# Patient Record
Sex: Female | Born: 1960
Health system: Southern US, Community
[De-identification: ages and names within clinical notes are randomized; demographics above are authoritative.]

## PROBLEM LIST (undated history)

## (undated) DIAGNOSIS — I2699 Other pulmonary embolism without acute cor pulmonale: Secondary | ICD-10-CM

## (undated) DIAGNOSIS — J45909 Unspecified asthma, uncomplicated: Secondary | ICD-10-CM

## (undated) DIAGNOSIS — W3400XA Accidental discharge from unspecified firearms or gun, initial encounter: Secondary | ICD-10-CM

## (undated) DIAGNOSIS — M1990 Unspecified osteoarthritis, unspecified site: Secondary | ICD-10-CM

## (undated) DIAGNOSIS — I10 Essential (primary) hypertension: Secondary | ICD-10-CM

## (undated) HISTORY — PX: OTHER SURGICAL HISTORY: SHX169

---

## 2003-09-23 ENCOUNTER — Emergency Department (HOSPITAL_COMMUNITY): Admission: EM | Admit: 2003-09-23 | Discharge: 2003-09-24 | Payer: Self-pay

## 2003-10-25 ENCOUNTER — Ambulatory Visit: Payer: Self-pay | Admitting: Family Medicine

## 2005-03-02 ENCOUNTER — Emergency Department (HOSPITAL_COMMUNITY): Admission: EM | Admit: 2005-03-02 | Discharge: 2005-03-02 | Payer: Self-pay | Admitting: *Deleted

## 2005-03-04 ENCOUNTER — Ambulatory Visit: Payer: Self-pay | Admitting: *Deleted

## 2005-04-15 ENCOUNTER — Ambulatory Visit: Payer: Self-pay | Admitting: Family Medicine

## 2005-04-28 ENCOUNTER — Ambulatory Visit: Payer: Self-pay | Admitting: Family Medicine

## 2005-08-05 ENCOUNTER — Ambulatory Visit: Payer: Self-pay | Admitting: Family Medicine

## 2005-08-11 ENCOUNTER — Ambulatory Visit (HOSPITAL_COMMUNITY): Admission: RE | Admit: 2005-08-11 | Discharge: 2005-08-11 | Payer: Self-pay | Admitting: Family Medicine

## 2005-08-25 ENCOUNTER — Encounter: Admission: RE | Admit: 2005-08-25 | Discharge: 2005-08-25 | Payer: Self-pay | Admitting: Family Medicine

## 2005-11-11 ENCOUNTER — Ambulatory Visit: Payer: Self-pay | Admitting: Family Medicine

## 2006-03-16 ENCOUNTER — Inpatient Hospital Stay (HOSPITAL_COMMUNITY): Admission: EM | Admit: 2006-03-16 | Discharge: 2006-03-22 | Payer: Self-pay | Admitting: Emergency Medicine

## 2006-09-21 ENCOUNTER — Encounter (INDEPENDENT_AMBULATORY_CARE_PROVIDER_SITE_OTHER): Payer: Self-pay | Admitting: Family Medicine

## 2006-11-04 DIAGNOSIS — I10 Essential (primary) hypertension: Secondary | ICD-10-CM | POA: Insufficient documentation

## 2006-11-04 DIAGNOSIS — K055 Other periodontal diseases: Secondary | ICD-10-CM

## 2006-11-04 DIAGNOSIS — Z9189 Other specified personal risk factors, not elsewhere classified: Secondary | ICD-10-CM

## 2006-11-04 DIAGNOSIS — K089 Disorder of teeth and supporting structures, unspecified: Secondary | ICD-10-CM | POA: Insufficient documentation

## 2007-02-01 ENCOUNTER — Ambulatory Visit: Payer: Self-pay | Admitting: Family Medicine

## 2007-02-01 DIAGNOSIS — J441 Chronic obstructive pulmonary disease with (acute) exacerbation: Secondary | ICD-10-CM

## 2007-08-24 ENCOUNTER — Ambulatory Visit: Payer: Self-pay | Admitting: Internal Medicine

## 2007-08-24 ENCOUNTER — Encounter (INDEPENDENT_AMBULATORY_CARE_PROVIDER_SITE_OTHER): Payer: Self-pay | Admitting: Family Medicine

## 2009-04-14 ENCOUNTER — Emergency Department (HOSPITAL_COMMUNITY): Admission: EM | Admit: 2009-04-14 | Discharge: 2009-04-14 | Payer: Self-pay | Admitting: Emergency Medicine

## 2009-07-03 ENCOUNTER — Inpatient Hospital Stay (HOSPITAL_COMMUNITY): Admission: EM | Admit: 2009-07-03 | Discharge: 2009-07-09 | Payer: Self-pay | Admitting: Emergency Medicine

## 2009-07-03 ENCOUNTER — Encounter: Payer: Self-pay | Admitting: Physician Assistant

## 2009-07-11 ENCOUNTER — Ambulatory Visit: Payer: Self-pay | Admitting: Physician Assistant

## 2009-07-11 ENCOUNTER — Encounter: Payer: Self-pay | Admitting: Physician Assistant

## 2009-07-15 ENCOUNTER — Ambulatory Visit: Payer: Self-pay | Admitting: Physician Assistant

## 2009-07-17 ENCOUNTER — Ambulatory Visit: Payer: Self-pay | Admitting: Physician Assistant

## 2009-07-18 ENCOUNTER — Encounter: Payer: Self-pay | Admitting: Physician Assistant

## 2009-07-19 ENCOUNTER — Ambulatory Visit: Payer: Self-pay | Admitting: Physician Assistant

## 2009-07-19 DIAGNOSIS — D509 Iron deficiency anemia, unspecified: Secondary | ICD-10-CM

## 2009-07-19 DIAGNOSIS — T148XXA Other injury of unspecified body region, initial encounter: Secondary | ICD-10-CM | POA: Insufficient documentation

## 2009-07-19 DIAGNOSIS — J449 Chronic obstructive pulmonary disease, unspecified: Secondary | ICD-10-CM | POA: Insufficient documentation

## 2009-07-19 DIAGNOSIS — K219 Gastro-esophageal reflux disease without esophagitis: Secondary | ICD-10-CM | POA: Insufficient documentation

## 2009-07-19 DIAGNOSIS — I2699 Other pulmonary embolism without acute cor pulmonale: Secondary | ICD-10-CM | POA: Insufficient documentation

## 2009-07-19 LAB — CONVERTED CEMR LAB
Basophils Absolute: 0.1 10*3/uL (ref 0.0–0.1)
Basophils Relative: 0 % (ref 0–1)
MCHC: 31.8 g/dL (ref 30.0–36.0)
MCV: 97.7 fL (ref 78.0–100.0)
Monocytes Absolute: 0.8 10*3/uL (ref 0.1–1.0)
Monocytes Relative: 6 % (ref 3–12)
Neutro Abs: 8.6 10*3/uL — ABNORMAL HIGH (ref 1.7–7.7)
Neutrophils Relative %: 71 % (ref 43–77)
OCCULT 1: POSITIVE
OCCULT 2: NEGATIVE
Platelets: 511 10*3/uL — ABNORMAL HIGH (ref 150–400)

## 2009-07-22 ENCOUNTER — Ambulatory Visit: Payer: Self-pay | Admitting: Physician Assistant

## 2009-07-25 ENCOUNTER — Encounter: Payer: Self-pay | Admitting: Physician Assistant

## 2009-07-29 ENCOUNTER — Ambulatory Visit: Payer: Self-pay | Admitting: Physician Assistant

## 2009-08-02 ENCOUNTER — Ambulatory Visit: Payer: Self-pay | Admitting: Physician Assistant

## 2009-08-06 ENCOUNTER — Ambulatory Visit: Payer: Self-pay | Admitting: Physician Assistant

## 2009-08-06 ENCOUNTER — Telehealth: Payer: Self-pay | Admitting: Physician Assistant

## 2009-08-07 ENCOUNTER — Encounter: Payer: Self-pay | Admitting: Physician Assistant

## 2009-08-07 LAB — CONVERTED CEMR LAB
Basophils Relative: 1 % (ref 0–1)
Eosinophils Absolute: 0.1 10*3/uL (ref 0.0–0.7)
Eosinophils Relative: 2 % (ref 0–5)
Lymphocytes Relative: 56 % — ABNORMAL HIGH (ref 12–46)
Lymphs Abs: 2.6 10*3/uL (ref 0.7–4.0)
MCHC: 32 g/dL (ref 30.0–36.0)
MCV: 96.4 fL (ref 78.0–100.0)
Monocytes Absolute: 0.4 10*3/uL (ref 0.1–1.0)
Monocytes Relative: 9 % (ref 3–12)
Neutro Abs: 1.4 10*3/uL — ABNORMAL LOW (ref 1.7–7.7)
WBC: 4.6 10*3/uL (ref 4.0–10.5)

## 2009-08-14 ENCOUNTER — Ambulatory Visit: Payer: Self-pay | Admitting: Physician Assistant

## 2009-08-19 ENCOUNTER — Encounter: Payer: Self-pay | Admitting: Physician Assistant

## 2009-08-21 ENCOUNTER — Ambulatory Visit: Payer: Self-pay | Admitting: Physician Assistant

## 2009-08-23 ENCOUNTER — Encounter: Payer: Self-pay | Admitting: Physician Assistant

## 2009-08-23 ENCOUNTER — Ambulatory Visit: Payer: Self-pay | Admitting: Internal Medicine

## 2009-08-24 ENCOUNTER — Ambulatory Visit (HOSPITAL_COMMUNITY): Admission: RE | Admit: 2009-08-24 | Discharge: 2009-08-24 | Payer: Self-pay | Admitting: Internal Medicine

## 2009-08-29 ENCOUNTER — Ambulatory Visit: Payer: Self-pay | Admitting: Physician Assistant

## 2009-09-05 ENCOUNTER — Telehealth: Payer: Self-pay | Admitting: Physician Assistant

## 2009-09-05 ENCOUNTER — Encounter: Payer: Self-pay | Admitting: Physician Assistant

## 2009-09-05 ENCOUNTER — Ambulatory Visit: Payer: Self-pay | Admitting: Nurse Practitioner

## 2009-09-05 DIAGNOSIS — M25569 Pain in unspecified knee: Secondary | ICD-10-CM

## 2009-09-05 DIAGNOSIS — R609 Edema, unspecified: Secondary | ICD-10-CM

## 2009-09-05 DIAGNOSIS — K921 Melena: Secondary | ICD-10-CM

## 2009-09-06 ENCOUNTER — Telehealth: Payer: Self-pay | Admitting: Physician Assistant

## 2009-09-06 LAB — CONVERTED CEMR LAB
Basophils Absolute: 0 10*3/uL (ref 0.0–0.1)
Eosinophils Relative: 1 % (ref 0–5)
HCT: 39.9 % (ref 36.0–46.0)
Hemoglobin: 13.3 g/dL (ref 12.0–15.0)
INR: 1.1 (ref <1.50)
Lymphocytes Relative: 41 % (ref 12–46)
Lymphs Abs: 2.4 10*3/uL (ref 0.7–4.0)
MCHC: 33.3 g/dL (ref 30.0–36.0)
Platelets: 334 10*3/uL (ref 150–400)
Prothrombin Time: 14.4 s (ref 11.6–15.2)
RBC: 4.21 M/uL (ref 3.87–5.11)
RDW: 16 % — ABNORMAL HIGH (ref 11.5–15.5)

## 2009-09-11 ENCOUNTER — Ambulatory Visit: Payer: Self-pay | Admitting: Physician Assistant

## 2009-09-15 ENCOUNTER — Encounter: Payer: Self-pay | Admitting: Physician Assistant

## 2009-09-18 ENCOUNTER — Telehealth: Payer: Self-pay | Admitting: Physician Assistant

## 2009-09-18 ENCOUNTER — Encounter: Payer: Self-pay | Admitting: Physician Assistant

## 2009-09-19 ENCOUNTER — Ambulatory Visit: Payer: Self-pay | Admitting: Physician Assistant

## 2009-09-19 LAB — CONVERTED CEMR LAB
CO2: 20 meq/L (ref 19–32)
Calcium: 9.7 mg/dL (ref 8.4–10.5)
Chloride: 100 meq/L (ref 96–112)
Creatinine, Ser: 0.79 mg/dL (ref 0.40–1.20)
INR: 1.87 — ABNORMAL HIGH (ref <1.50)
Sodium: 135 meq/L (ref 135–145)

## 2009-09-20 ENCOUNTER — Encounter: Payer: Self-pay | Admitting: Physician Assistant

## 2009-09-20 ENCOUNTER — Telehealth: Payer: Self-pay | Admitting: Physician Assistant

## 2009-09-25 ENCOUNTER — Ambulatory Visit: Payer: Self-pay | Admitting: Sports Medicine

## 2009-09-25 ENCOUNTER — Encounter (INDEPENDENT_AMBULATORY_CARE_PROVIDER_SITE_OTHER): Payer: Self-pay | Admitting: *Deleted

## 2009-09-25 DIAGNOSIS — IMO0002 Reserved for concepts with insufficient information to code with codable children: Secondary | ICD-10-CM

## 2009-10-07 ENCOUNTER — Encounter: Payer: Self-pay | Admitting: Physician Assistant

## 2009-10-07 ENCOUNTER — Ambulatory Visit: Payer: Self-pay | Admitting: Physician Assistant

## 2009-10-11 ENCOUNTER — Encounter: Payer: Self-pay | Admitting: Physician Assistant

## 2009-10-30 ENCOUNTER — Telehealth: Payer: Self-pay | Admitting: Physician Assistant

## 2009-10-30 ENCOUNTER — Encounter: Payer: Self-pay | Admitting: Physician Assistant

## 2009-11-01 ENCOUNTER — Encounter: Payer: Self-pay | Admitting: Physician Assistant

## 2009-11-06 ENCOUNTER — Ambulatory Visit: Payer: Self-pay | Admitting: Physician Assistant

## 2009-11-06 DIAGNOSIS — R7309 Other abnormal glucose: Secondary | ICD-10-CM

## 2009-11-06 LAB — CONVERTED CEMR LAB: Hgb A1c MFr Bld: 5.3 %

## 2009-11-07 LAB — CONVERTED CEMR LAB
INR: 1.02 (ref <1.50)
Prothrombin Time: 13.6 s (ref 11.6–15.2)

## 2009-11-11 ENCOUNTER — Encounter: Payer: Self-pay | Admitting: Sports Medicine

## 2009-11-13 ENCOUNTER — Encounter (INDEPENDENT_AMBULATORY_CARE_PROVIDER_SITE_OTHER): Payer: Self-pay | Admitting: *Deleted

## 2009-11-15 ENCOUNTER — Encounter: Payer: Self-pay | Admitting: Physician Assistant

## 2009-12-03 ENCOUNTER — Ambulatory Visit (HOSPITAL_COMMUNITY): Admission: RE | Admit: 2009-12-03 | Discharge: 2009-12-03 | Payer: Self-pay | Admitting: Internal Medicine

## 2009-12-03 ENCOUNTER — Encounter (INDEPENDENT_AMBULATORY_CARE_PROVIDER_SITE_OTHER): Payer: Self-pay | Admitting: Internal Medicine

## 2009-12-05 ENCOUNTER — Encounter (INDEPENDENT_AMBULATORY_CARE_PROVIDER_SITE_OTHER): Payer: Self-pay | Admitting: *Deleted

## 2009-12-05 ENCOUNTER — Telehealth (INDEPENDENT_AMBULATORY_CARE_PROVIDER_SITE_OTHER): Payer: Self-pay | Admitting: *Deleted

## 2009-12-10 ENCOUNTER — Ambulatory Visit: Payer: Self-pay | Admitting: Family Medicine

## 2009-12-19 ENCOUNTER — Encounter (INDEPENDENT_AMBULATORY_CARE_PROVIDER_SITE_OTHER): Payer: Self-pay | Admitting: Internal Medicine

## 2010-03-13 NOTE — Letter (Signed)
Summary: ANTICOAGULATION DOSING  ANTICOAGULATION DOSING   Imported By: Arta Bruce 10/17/2009 14:11:38  _____________________________________________________________________  External Attachment:    Type:   Image     Comment:   External Document

## 2010-03-13 NOTE — Letter (Signed)
Summary: ANTICOAGULATION DOSING  ANTICOAGULATION DOSING   Imported By: Arta Bruce 2020/03/2609 09:17:02  _____________________________________________________________________  External Attachment:    Type:   Image     Comment:   External Document

## 2010-03-13 NOTE — Progress Notes (Signed)
Summary: [facility] referral  Phone Note Outgoing Call   Summary of Call: Refer to [facility] Medicine Clinic for left knee pain. Initial call taken by: Brynda Rim,  September 05, 2009 2:56 PM

## 2010-03-13 NOTE — Letter (Signed)
Summary: ANTICOAGULATION DOSING   ANTICOAGULATION DOSING   Imported By: Arta Bruce 10/02/2009 12:51:56  _____________________________________________________________________  External Attachment:    Type:   Image     Comment:   External Document

## 2010-03-13 NOTE — Letter (Signed)
Summary: ANTICOAGULATION DOSING  ANTICOAGULATION DOSING   Imported By: Arta Bruce 08/21/2009 11:00:07  _____________________________________________________________________  External Attachment:    Type:   Image     Comment:   External Document

## 2010-03-13 NOTE — Letter (Signed)
Summary: ANTICOAGULATION DOSING  ANTICOAGULATION DOSING   Imported By: Arta Bruce 08/13/2009 12:48:48  _____________________________________________________________________  External Attachment:    Type:   Image     Comment:   External Document

## 2010-03-13 NOTE — Letter (Signed)
Summary: ANTICOAGULATION DOSING  ANTICOAGULATION DOSING   Imported By: Arta Bruce 08/21/2009 11:03:48  _____________________________________________________________________  External Attachment:    Type:   Image     Comment:   External Document

## 2010-03-13 NOTE — Letter (Signed)
Summary: Diability Determination Service  Diability Determination Service   Imported By: Marily Memos 11/18/2009 11:31:06  _____________________________________________________________________  External Attachment:    Type:   Image     Comment:   External Document
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
## 2010-03-13 NOTE — Miscellaneous (Signed)
  Clinical Lists Changes  Medications: Changed medication from COUMADIN 10 MG TABS (WARFARIN SODIUM) 1 by mouth on Mon, Wed, Fri and 1/2 all other days to COUMADIN 10 MG TABS (WARFARIN SODIUM) 1 by mouth on Mon, Wed, Fri Changed medication from WARFARIN SODIUM 2.5 MG TABS (WARFARIN SODIUM) Take with other other (coumadin) warfarin on Sat. and Sun. only to equal 7.5 mg on Sat. and Sun. to COUMADIN 5 MG TABS (WARFARIN SODIUM) 1 by mouth on Tues, Thurs, Sat and Sun

## 2010-03-13 NOTE — Letter (Signed)
Summary: PPD READING  PPD READING   Imported By: Arta Bruce 09/25/2009 11:25:52  _____________________________________________________________________  External Attachment:    Type:   Image     Comment:   External Document

## 2010-03-13 NOTE — Letter (Signed)
Summary: ANTICOAGULATION DOSING  ANTICOAGULATION DOSING   Imported By: Arta Bruce 08/21/2009 11:01:36  _____________________________________________________________________  External Attachment:    Type:   Image     Comment:   External Document

## 2010-03-13 NOTE — Letter (Signed)
Summary: ANTICOAGULATION DOSING  ANTICOAGULATION DOSING   Imported By: Arta Bruce 08/13/2009 12:50:47  _____________________________________________________________________  External Attachment:    Type:   Image     Comment:   External Document

## 2010-03-13 NOTE — Letter (Signed)
Summary: ANTICOAGULATION DOSING  ANTICOAGULATION DOSING   Imported By: Arta Bruce 09/23/2009 12:23:13  _____________________________________________________________________  External Attachment:    Type:   Image     Comment:   External Document

## 2010-03-13 NOTE — Progress Notes (Signed)
Summary: med refill request  Phone Note Call from Patient   Caller: Patient Summary of Call: Patient called requesting refill on Norco.  States she did not realize that she had scheduled 2 appointments for yesterday which is why she missed her appt here.  She called yesterday and rescheduled appt here for Nov 1st. Initial call taken by: Rochele Pages RN,  December 05, 2009 4:53 PM  Follow-up for Phone Call        Spoke with Dr. Nedra Hai- he advised no refill until pt is seen again in clinic on Nov 1.   She did come for a 1 or 2 month f/u, and the med has been refilled once since last visit in Aug.  Called pt she was not available, left message with female at her residence to return my call.  Follow-up by: Rochele Pages RN,  December 06, 2009 12:08 PM

## 2010-03-13 NOTE — Progress Notes (Signed)
Summary: NEEDS INHALER REFILLED  Phone Note Call from Patient Call back at [phone]   Reason for Call: Refill Medication Summary of Call: WEAVER PT. MS Boehme CALL AND NEEDS HER VENTOLIN AND ADVAIR CALLED INTO [facility]. Initial call taken by: Leodis Rains,  October 30, 2009 4:06 PM  Follow-up for Phone Call        Do you want her Ventolin refilled?  Last Rx written was 01/2007.  Last seen 08/28/09.  Dutch Quint RN  October 31, 2009 2:22 PM  Left message on answering machine for pt. to return call.  Dutch Quint RN  November 01, 2009 11:29 AM  Per pharmacy -- needs proof of income, including support letter, so that she can get Advair.  Left message on voicemail for pt. to return call.  Dutch Quint RN  November 04, 2009 11:16 AM  Unable to leave message Programmer, applications customer not available."  Dutch Quint RN  November 05, 2009 10:37 AM  Left message with female for pt. to return call.  Dutch Quint RN  November 06, 2009 2:40 PM  Left message with female for pt. to return call.  Dutch Quint RN  November 07, 2009 5:23 PM   Additional Follow-up for Phone Call Additional follow up Details #1::        pt seen in [facility] this week  Additional Follow-up by: Tereso Newcomer PA-C,  November 08, 2009 8:49 AM    New/Updated Medications: PROVENTIL HFA 108 (90 BASE) MCG/ACT AERS (ALBUTEROL SULFATE) 1-2 puffs every 4-6 hours as needed Prescriptions: PROVENTIL HFA 108 (90 BASE) MCG/ACT AERS (ALBUTEROL SULFATE) 1-2 puffs every 4-6 hours as needed  #1 x 11   Entered and Authorized by:   Tereso Newcomer PA-C   Signed by:   Tereso Newcomer PA-C on 10/31/2009   Method used:   Faxed to ...       [facility] (retail)       [address] [place], [place]  [address]       Ph: [phone]       Fax: [phone]   RxID:   [id number] ADVAIR DISKUS 250-50 MCG/DOSE AEPB (FLUTICASONE-SALMETEROL) 1 puff two times a day  #1 x 11   Entered and  Authorized by:   Tereso Newcomer PA-C   Signed by:   Tereso Newcomer PA-C on 10/31/2009   Method used:   Faxed to ...       [facility] (retail)       [address] [place], [place]  [address]       Ph: [phone]       Fax: [phone]   RxID:   [id number]

## 2010-03-13 NOTE — Letter (Signed)
Summary: ANTICOAGULATION DOSING  ANTICOAGULATION DOSING   Imported By: Arta Bruce 08/13/2009 12:49:50  _____________________________________________________________________  External Attachment:    Type:   Image     Comment:   External Document

## 2010-03-13 NOTE — Miscellaneous (Signed)
  Patient needs to repeat stool cards x 3. She had one positive and 3 negative. Spoke to Dr. Corinda Gubler. He suggested we repeat the cards. Has appt today.  If she no shows, make sure we get in touch with her to repeat stool cards. Tereso Newcomer PA-C  September 05, 2009 4:49 AM    pt is aware when she came into office..... [name]  September 06, 2009 11:22 AM  Clinical Lists Changes  Problems: Added new problem of HEMOCCULT POSITIVE STOOL (ICD-578.1) - Signed Assessed ANEMIA-IRON DEFICIENCY as comment only - patient had 1 out of 4 stools positive on hemoccults spoke with Dr. Corinda Gubler he suggested repeating hemoccults first if positive again (eg 2 or 3 pos cards), then refer for colo + EGD he can do survey on coumadin, but would not be able to remove polyps or do bx.  Her updated medication list for this problem includes:    Ferrous Sulfate 325 (65 Fe) Mg Tabs (Ferrous sulfate) .Marland Kitchen... Take 1 tablet by mouth two times a day  - Signed Assessed HEMOCCULT POSITIVE STOOL as comment only - patient had 1 out of 4 stools positive on hemoccults spoke with Dr. Corinda Gubler he suggested repeating hemoccults first if positive again (eg 2 or 3 pos cards), then refer for colo + EGD he can do survey on coumadin, but would not be able to remove polyps or do bx.  - Signed       Impression & Recommendations:  Problem # 1:  ANEMIA-IRON DEFICIENCY (ICD-280.9) patient had 1 out of 4 stools positive on hemoccults spoke with Dr. Corinda Gubler he suggested repeating hemoccults first if positive again (eg 2 or 3 pos cards), then refer for colo + EGD he can do survey on coumadin, but would not be able to remove polyps or do bx.  Her updated medication list for this problem includes:    Ferrous Sulfate 325 (65 Fe) Mg Tabs (Ferrous sulfate) .Marland Kitchen... Take 1 tablet by mouth two times a day  Problem # 2:  HEMOCCULT POSITIVE STOOL (ICD-578.1) patient had 1 out of 4 stools positive on hemoccults spoke with Dr. Corinda Gubler he  suggested repeating hemoccults first if positive again (eg 2 or 3 pos cards), then refer for colo + EGD he can do survey on coumadin, but would not be able to remove polyps or do bx.  Complete Medication List: 1)  Coumadin 10 Mg Tabs (Warfarin sodium) .... 1/2 tablet once daily 2)  Prednisone 10 Mg Tabs (Prednisone) .... Two tabs three times a day 3)  Proventil Hfa 108 (90 Base) Mcg/act Aers (Albuterol sulfate) .... Two puffs every day as needed 4)  Advair Diskus 250-50 Mcg/dose Aepb (Fluticasone-salmeterol) .Marland Kitchen.. 1 puff two times a day 5)  Ferrous Sulfate 325 (65 Fe) Mg Tabs (Ferrous sulfate) .... Take 1 tablet by mouth two times a day 6)  Pepcid 20 Mg Tabs (Famotidine) .... Take 1 tablet by mouth two times a day 7)  Warfarin Sodium 2.5 Mg Tabs (Warfarin sodium) .... Take with other other (coumadin) warfarin on sat. and sun. only to equal 7.5 mg on sat. and sun.
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
## 2010-03-13 NOTE — Miscellaneous (Signed)
  Clinical Lists Changes  Medications: Changed medication from COUMADIN 10 MG TABS (WARFARIN SODIUM) Take 1 tablet by mouth once a day except 1/2 tab on Thurs and Sat. to COUMADIN 10 MG TABS (WARFARIN SODIUM) Take 1 tablet by mouth once a day except 1/2 (5 mg) on Thursday and Saturday

## 2010-03-13 NOTE — Letter (Signed)
Summary: ANTICOAGULATION DOSING  ANTICOAGULATION DOSING   Imported By: Arta Bruce 08/21/2009 11:02:48  _____________________________________________________________________  External Attachment:    Type:   Image     Comment:   External Document

## 2010-03-13 NOTE — Letter (Signed)
Summary: ANTICOAGULATION DOSING  ANTICOAGULATION DOSING   Imported By: Arta Bruce 11/05/2009 15:09:37  _____________________________________________________________________  External Attachment:    Type:   Image     Comment:   External Document

## 2010-03-13 NOTE — Assessment & Plan Note (Signed)
Summary: COPD Exacerbation   Vital Signs:  Patient profile:   50 year old female Height:      65 inches Weight:      205 pounds BMI:     34.24 O2 Sat:      94 % on Room air Temp:     98.8 degrees F oral Pulse rate:   88 / minute Pulse rhythm:   regular Resp:     18 per minute BP sitting:   130 / 86  (left arm) Cuff size:   large  Vitals Entered By: [name] (November 06, 2009 9:55 AM)  O2 Flow:  Room air CC: two month f/u..., Hypertension Management Is Patient Diabetic? Yes Pain Assessment Patient in pain? no      CBG Result 123  Does patient need assistance? Functional Status Self care Ambulation Normal Comments pf  1.    110     2.    120      3.   100   Primary Care Provider:  Tereso Newcomer, PA-C  CC:  two month f/u... and Hypertension Management.  History of Present Illness: Here for follow up.  COPD:  Ran out of Advair 1 week ago.  Coughing more.  +wheezing.  Using proventil 2-3 x a day.  Interrupting sleep.  Sputum is yellow.  Scant amounts.  No hemoptysis.  +SOB.  No chest tightness.  Had a headache.  No fever.  Still smoking.  Hypertension History:      She complains of headache, but denies chest pain.  She notes no problems with any antihypertensive medication side effects.        Positive major cardiovascular risk factors include hypertension and current tobacco user.  Negative major cardiovascular risk factors include female age less than 51 years old.     Problems Prior to Update: 1)  Hyperglycemia  (ICD-790.29) 2)  Medial Meniscus Tear, Left  (ICD-836.0) 3)  Positive Ppd  (ICD-795.5) 4)  Knee Pain, Left  (ICD-719.46) 5)  Edema  (ICD-782.3) 6)  Hemoccult Positive Stool  (ICD-578.1) 7)  Hematoma  (ICD-924.9) 8)  Coumadin Therapy  (ICD-V58.61) 9)  Pulmonary Embolism  (ICD-415.19) 10)  Family History Diabetes 1st Degree Relative  (ICD-V18.0) 11)  Gerd  (ICD-530.81) 12)  Hypertension  (ICD-401.9) 13)  Anemia-iron Deficiency  (ICD-280.9) 14)   COPD  (ICD-496) 15)  Chronic Obstructive Pulmonary Disease, Acute Exacerbation  (ICD-491.21) 16)  Disease, 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  coumadin started 07/03/2009   b.  hypercoag panel with low Prot C and S (done while on coumadin); Lupus anticoag. not detected   c.  patient will need 6-12 mos of coumadin; plan to repeat Prot C and S levels after off coumadin x 2 mos. LLL pneumonia 06/2009 COPD Anemia-iron deficiency Hypertension   a.  no meds in years GERD + PPD 08/2009 L Knee Meniscal Tear   a.  [facility]; placed in brace and f/u planned; poss. may heal on its own  Social History: Reviewed history from 07/19/2009 and no changes required. separated 2 kids not working Current Smoker   a.  1/2 ppd now - (33 pack years) Alcohol use-yes   a. beer - 40 oz every other day  Drug use-yes (crack; no iv drugs)  Physical Exam  General:  alert, well-developed, and well-nourished.   Head:  normocephalic and atraumatic.   Eyes:  pupils equal, pupils round, and pupils reactive to light.   Ears:  R ear normal and L ear normal.   Nose:  no external deformity.   Mouth:  no exudates.  + cobblestoning  Neck:  supple and no cervical lymphadenopathy.   Lungs:  poor air movement exp wheezes throughout no rales  Heart:  normal rate and regular rhythm.   Extremities:  no edema  Neurologic:  alert & oriented X3 and cranial nerves II-XII intact.   Skin:  turgor normal.   Psych:  normally interactive.     Impression & Recommendations:  Problem # 1:  HYPERGLYCEMIA (ICD-790.29) A1C normal  Orders: Hemoglobin A1C (83036)  Problem # 2:  HEMOCCULT POSITIVE STOOL (ICD-578.1) never repeated stool cards f/u CBC has  demonstrated normal hgb's  Orders: T-Hemoccult Cards-Multiple (82270)  Problem # 3:  EDEMA (ICD-782.3) Assessment: Improved never did echo reschedule with h/o PE  The following medications were removed from the medication list:    Lisinopril-hydrochlorothiazide 20-25 Mg Tabs (Lisinopril-hydrochlorothiazide) .Marland Kitchen... Take 1 tab by mouth daily for high blood pressure Her updated medication list for this problem includes:    Hydrochlorothiazide 25 Mg Tabs (Hydrochlorothiazide) .Marland Kitchen... Take 1/2 tab once daily for blood pressure  Problem # 4:  COUMADIN THERAPY (ICD-V58.61)  needs INR today  Orders: T-Protime, Auto ([id number])  Problem # 5:  HYPERTENSION (ICD-401.9) controlled  The following medications were removed from the medication list:    Lisinopril-hydrochlorothiazide 20-25 Mg Tabs (Lisinopril-hydrochlorothiazide) .Marland Kitchen... Take 1 tab by mouth daily for high blood pressure Her updated medication list for this problem includes:    Hydrochlorothiazide 25 Mg Tabs (Hydrochlorothiazide) .Marland Kitchen... Take 1/2 tab once daily for blood pressure  Problem # 6:  CHRONIC OBSTRUCTIVE PULMONARY DISEASE, ACUTE EXACERBATION (ICD-491.21)  may just be related to running out of advair alb/atrovent neb in office samples of sympbicort given . . . she can use until she gets Advair will go ahead and tx with antibxs given her symptoms and lung exam concerned she will get worse without taking by mouth prednisone  will decreased her coumadin while taking with close follow up in the [facility] pharmacy  Orders: Nebulizer Tx (30865)  Problem # 7:  PULMONARY EMBOLISM (ICD-415.19) complete 6-12 mos coumadin recheck prot c and s off coumadin x 2 mos  Her updated medication list for this problem includes:    Coumadin 10 Mg Tabs (Warfarin sodium) .Marland Kitchen... Take 1 tablet by mouth once a day except 1/2 (5 mg) on thursday and saturday  Complete Medication List: 1)  Peridex 0.12 % Soln (Chlorhexidine gluconate) ....  Swish 15 ml(1 tablespoon) in mouth for 30 seconds two times a day 2)  Coumadin 10 Mg Tabs (Warfarin sodium) .... Take 1 tablet by mouth once a day except 1/2 (5 mg) on thursday and saturday 3)  Proventil Hfa 108 (90 Base) Mcg/act Aers (Albuterol sulfate) .Marland Kitchen.. 1-2 puffs every 4-6 hours as needed 4)  Advair Diskus 250-50 Mcg/dose Aepb (Fluticasone-salmeterol) .Marland Kitchen.. 1 puff two times a day 5)  Ferrous Sulfate 325 (65 Fe) Mg Tabs (Ferrous sulfate) .... Take 1 tablet by mouth two times a day 6)  Pepcid 20 Mg Tabs (Famotidine) .... Take 1 tablet by mouth two times a day 7)  Hydrochlorothiazide 25 Mg Tabs (Hydrochlorothiazide) .... Take 1/2 tab once daily for blood pressure 8)  Norco  5-325 Mg Tabs (Hydrocodone-acetaminophen) .... I two times a day prn 9)  Prednisone 10 Mg Tabs (Prednisone) .... Take 3 tabs today and thursday, 2 tabs on friday and saturday, 1 tab on sunday and monday and stop. 10)  Tessalon Perles 100 Mg Caps (Benzonatate) .... Take 1 capsule by mouth three times a day as needed for cough 11)  Doxycycline Hyclate 100 Mg Caps (Doxycycline hyclate) .... Take 1 capsule by mouth two times a day for one week 12)  Symbicort 160-4.5 Mcg/act Aero (Budesonide-formoterol fumarate) .... 2 puffs two times a day  Hypertension Assessment/Plan:      The patient's hypertensive risk group is category B: At least one risk factor (excluding diabetes) with no target organ damage.  Today's blood pressure is 130/86.  Her blood pressure goal is < 140/90.  Patient Instructions: 1)  I am putting you on prednisone and an antibiotic for your cough.  These will make your coumadin level higher. 2)  I want you to take less coumadin for a few days.  So, I want you to take coumadin (warfarin) 5 mg today, Thursday and Friday.  You can resume your usual dose on Saturday (which is 10 mg once daily except 5 mg on Thursdays and Saturdays). 3)  You need your INR checked again in one week.  We will arrange an appt with the  [facility]. pharmacy. 4)  It is important that we keep a close eye on your coumadin over the next 2-3 weeks. 5)  Take the Doxycycline until all gone. 6)  You can use the [employer] as needed for cough. 7)  The symbicort is to take in place of the Advair for now.  You inhale 2 puffs two times a day.  You can switch back to Advair once you get this filled at the pharmacy. 8)  Get plenty of rest.  Drink plenty of fluids.  Take tylenol as needed. 9)  Schedule follow up in 1 week with Scott for cough.  Return sooner if worse or go to the emergency room. Prescriptions: DOXYCYCLINE HYCLATE 100 MG CAPS (DOXYCYCLINE HYCLATE) Take 1 capsule by mouth two times a day for one week  #14 x 0   Entered and Authorized by:   Tereso Newcomer PA-C   Signed by:   Tereso Newcomer PA-C on 11/06/2009   Method used:   Print then Give to Patient   RxID:   [id number] TESSALON PERLES 100 MG CAPS (BENZONATATE) Take 1 capsule by mouth three times a day as needed for cough  #30 x 0   Entered and Authorized by:   Tereso Newcomer PA-C   Signed by:   Tereso Newcomer PA-C on 11/06/2009   Method used:   Print then Give to Patient   RxID:   [id number] PREDNISONE 10 MG TABS (PREDNISONE) Take 3 tabs today and Thursday, 2 tabs on Friday and Saturday, 1 tab on Sunday and Monday and stop.  #12 x 0   Entered and Authorized by:   Tereso Newcomer PA-C   Signed by:   Tereso Newcomer PA-C on 11/06/2009   Method used:   Print then Give to Patient   RxID:   [id number]   Laboratory Results   Blood Tests   Date/Time Received: November 06, 2009 10:08 AM   HGBA1C: 5.3%   (Normal Range: Non-Diabetic - 3-6%   Control Diabetic - 6-8%) CBG Random:: 123mg /dL

## 2010-03-13 NOTE — Letter (Signed)
Summary: *HSN Results Follow up  [facility]  [address] [place], [place] [address]   Phone: [phone]  Fax: [phone]      12/05/2009   [facility] [place] [place], [place]  [address]   Dear  Ms. Kelicia Early,                            ____S.Drinkard,FNP   ____D. Gore,FNP       ____B. McPherson,MD   ____V. Rankins,MD    ____E. Mulberry,MD    ____N. Daphine Deutscher, FNP  ____D. Reche [place], MD    ____K. Philipp Deputy, MD    ___X Other     This letter is to inform you that your recent test(s):  _______Pap Smear    _______Lab Test     _______X-ray    _______ is within acceptable limits  _______ requires a medication change  _______ requires a follow-up lab visit  _______ requires a follow-up visit with your Coreon Simkins   Comments:I BEING TRYING TO REACH YOU @ [phone] LEAVE YOU A SEVERAL                                                        MESSAGES PLEASE RETURN MY CALL I JUST WANT TO KNOW WHY YOUR NO SHOW TO YOUR APPT 12-03-09 @ 3 PM AT [place] FOR 2D ECHO   THANK YOU        _________________________________________________________ If you have any questions, please contact our office                     Sincerely,  Cheryll Dessert [facility]

## 2010-03-13 NOTE — Letter (Signed)
Summary: *Consult Note  [facility]  [address]   [place], [place] [address]   Phone: [phone]  Fax: [phone]    Re:    Alexandria Jacobson DOB:    Mar 14, 1960   Dear: Tereso Newcomer   Thank you for requesting that we see the above patient for consultation.  A copy of the detailed office note will be sent under separate cover, for your review.  Evaluation today is consistent with: Left medial meniscus tear   Our recommendation is for: donjoy knee brace, pain control, f/u 4 weeks   New Orders include:    New Medications started today include:    After today's visit, the patients current medications include: 1)  COUMADIN 10 MG TABS (WARFARIN SODIUM) 1 by mouth on Tues, Thurs, Sat, Sun 2)  PROVENTIL HFA 108 (90 BASE) MCG/ACT AERS (ALBUTEROL SULFATE) two puffs every day as needed 3)  ADVAIR DISKUS 250-50 MCG/DOSE AEPB (FLUTICASONE-SALMETEROL) 1 puff two times a day 4)  FERROUS SULFATE 325 (65 FE) MG TABS (FERROUS SULFATE) Take 1 tablet by mouth two times a day 5)  PEPCID 20 MG TABS (FAMOTIDINE) Take 1 tablet by mouth two times a day 6)  COUMADIN 5 MG TABS (WARFARIN SODIUM) 1 by mouth on Mon, Wed, Fri 7)  HYDROCHLOROTHIAZIDE 25 MG TABS (HYDROCHLOROTHIAZIDE) Take 1/2 tab once daily for blood pressure 8)  NORCO 5-325 MG TABS (HYDROCODONE-ACETAMINOPHEN) i two times a day prn   Thank you for this consultation.  If you have any further questions regarding the care of this patient, please do not hesitate to contact me @ [phone]  Thank you for this opportunity to look after your patient.  Sincerely,   Corbin Ade MD [facility]

## 2010-03-13 NOTE — Letter (Signed)
Summary: ANTICOAGULATION DOSING  ANTICOAGULATION DOSING   Imported By: Arta Bruce 09/23/2009 12:12:42  _____________________________________________________________________  External Attachment:    Type:   Image     Comment:   External Document
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
## 2010-03-13 NOTE — Miscellaneous (Signed)
  Clinical Lists Changes  Observations: Added new observation of TB PPDRESULT: 20 mm (08/21/2009 22:48)

## 2010-03-13 NOTE — Miscellaneous (Signed)
  Clinical Lists Changes  Medications: Removed medication of COUMADIN 5 MG TABS (WARFARIN SODIUM) 1 by mouth on Mon, Wed, Fri Changed medication from COUMADIN 10 MG TABS (WARFARIN SODIUM) 1 by mouth on Tues, Thurs, Sat, Sun to COUMADIN 10 MG TABS (WARFARIN SODIUM) Take 1 tablet by mouth once a day except 1/2 tab on Thurs and Sat.

## 2010-03-13 NOTE — Progress Notes (Signed)
  Phone Note Outgoing Call   Summary of Call: Please make sure Health Dept is aware of her + PPD result.  Initial call taken by: Tereso Newcomer PA-C,  September 18, 2009 10:49 PM  Follow-up for Phone Call        spoke with [facility] and they are aware of + ppd... pt reschedule one appt and tammy has been trying to reach her to schedule Follow-up by: [name],  September 19, 2009 11:21 AM
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
## 2010-03-13 NOTE — Letter (Signed)
Summary: *HSN Results Follow up  [facility]  [address] [place], [place] [address]   Phone: [phone]  Fax: [phone]      11/13/2009   [facility] [address] [place] [place], [place]  [address]   Dear  [name],                            ____S.Drinkard,FNP   ____D. Gore,FNP       ____B. McPherson,MD   ____V. Rankins,MD    ____E. Mulberry,MD    ____N. Daphine Deutscher, FNP  ____D. Reche Dixon, MD    ____K. Philipp Deputy, MD    ____Other     This letter is to inform you that your recent test(s):  _______Pap Smear    ___X____Lab Test     _______X-ray    _______ is within acceptable limits  ___X____ requires a medication change  _______ requires a follow-up lab visit  _______ requires a follow-up visit with your provider   Comments: We have been trying to reach you.  Please give the office a call at your earliest convenience.       _________________________________________________________ If you have any questions, please contact our office                     Sincerely,  [name] [facility]

## 2010-03-13 NOTE — Progress Notes (Signed)
Summary: Dr[place]  Phone Note Outgoing Call   Summary of Call: Stanton Kidney,  How can we reach Dr. Doreatha Martin to ask him about a patient? I have someone who needs a colo. but they are on coumadin and I need to discuss with him before he sees her.  Initial call taken by: Brynda Rim,  August 06, 2009 4:59 PM  Follow-up for Phone Call        You can call Heather x [phone] Follow-up by: Candi Leash,  August 07, 2009 8:22 AM

## 2010-03-13 NOTE — Letter (Signed)
Summary: RADIOLOGY  RADIOLOGY   Imported By: Arta Bruce 09/25/2009 11:30:31  _____________________________________________________________________  External Attachment:    Type:   Image     Comment:   External Document

## 2010-03-13 NOTE — Letter (Signed)
Summary: *HSN Results Follow up  [facility]  [address] [place], [place] [address]   Phone: [phone]  Fax: [phone]      12/05/2009   [facility] [address] [place], [place]  [address]   Dear  Ms. Breslyn Curless,                            ____S.Drinkard,FNP   ____D. Gore,FNP       ____B. McPherson,MD   ____V. Rankins,MD    ____E. Mulberry,MD    ____N. Daphine Deutscher, FNP  ____D. Reche Dixon, MD    ____K. Philipp Deputy, MD    ____Other     This letter is to inform you that your recent test(s):  _______Pap Smear    _______Lab Test     _______X-ray    _______ is within acceptable limits  _______ requires a medication change  _______ requires a follow-up lab visit  _______ requires a follow-up visit with your provider   Comments:       _________________________________________________________ If you have any questions, please contact our office                     Sincerely,  Cheryll Dessert [facility]

## 2010-03-13 NOTE — Letter (Signed)
Summary: MAILED REQUESTED RECORDS TO [facility]  MAILED REQUESTED RECORDS TO [facility]   Imported By: Arta Bruce 11/15/2009 12:57:58  _____________________________________________________________________  External Attachment:    Type:   Image     Comment:   External Document

## 2010-03-13 NOTE — Assessment & Plan Note (Signed)
Summary: 1 MONTH FU FOR BP & COPD//KT--CHECK PT/INR-YAJ   Vital Signs:  Patient profile:   50 year old female Weight:      210 pounds O2 Sat:      95 % on Room air Temp:     98.0 degrees F oral Pulse rate:   103 / minute Pulse rhythm:   regular Resp:     18 per minute BP sitting:   141 / 96  (left arm) Cuff size:   large  Vitals Entered By: [name] (September 05, 2009 2:08 PM)  O2 Flow:  Room air CC: one month... Is Patient Diabetic? No Pain Assessment Patient in pain? no       Does patient need assistance? Functional Status Self care Ambulation Normal   Primary Care Provider:  Tereso Newcomer, PA-C  CC:  one month....  History of Present Illness: 50 year old female presents for followup.  Her breathing is stable.  She is using her Advair every day.  She is using less of her rescue inhaler.  She denies significant wheezing or shortness of breath.  She denies any bleeding problems.  She had her Coumadin adjusted recently.  She supposed to have an INR checked today.  I discussed her case with hematology.  She did have a low protein S and protein C levels.  However, this is probably affected by her ongoing warfarin therapy.  The suggestion was to continue Coumadin for 6-12 months for her pulmonary embolism.  Then she can come off Coumadin and repeat her protein C and S. levels 2 months later.  I discussed her case with gastroenterology.  She had one of 4 cards positive.  Her hemoglobin normalized.  It was suggested that she have repeat stool cards.  If she continues to have positive stool cards, she will need colonoscopy as well as endoscopy to further evaluate.  This can be done on Coumadin.  However, no biopsy or polypectomy can be done.  The patient notes left knee pain.  Has noted for several weeks now.  She denies injury.  She notes mainly medial pain.  It hurts worse with extension of her knee.  She feels a locking sensation at times.  She feels as though her knee will slip out  from underneath her at times.  She has not used therapy on her own.  Seems to be getting worse.  Current Medications (verified): 1)  Coumadin 10 Mg Tabs (Warfarin Sodium) .... 1/2 Tablet Once Daily 2)  Prednisone 10 Mg Tabs (Prednisone) .... Two Tabs Three Times A Day 3)  Proventil Hfa 108 (90 Base) Mcg/act Aers (Albuterol Sulfate) .... Two Puffs Every Day As Needed 4)  Advair Diskus 250-50 Mcg/dose Aepb (Fluticasone-Salmeterol) .Marland Kitchen.. 1 Puff Two Times A Day 5)  Ferrous Sulfate 325 (65 Fe) Mg Tabs (Ferrous Sulfate) .... Take 1 Tablet By Mouth Two Times A Day 6)  Pepcid 20 Mg Tabs (Famotidine) .... Take 1 Tablet By Mouth Two Times A Day 7)  Warfarin Sodium 2.5 Mg Tabs (Warfarin Sodium) .... Take With Other Other (Coumadin) Warfarin On Sat. and Sun. Only To Equal 7.5 Mg On Sat. and Sun.  Allergies (verified): No Known Drug Allergies  Past History:  Past Medical History: Acute Pulmonary Embolism (06/2009)   a.  coumadin started 07/03/2009   b.  hypercoag panel with low Prot C and S (done while on coumadin); Lupus anticoag. not detected   c.  patient will need 6-12 mos of coumadin; plan to repeat Prot C  and S levels after off coumadin x 2 mos. LLL pneumonia 06/2009 COPD Anemia-iron deficiency Hypertension   a.  no meds in years GERD + PPD 08/2009  Physical Exam  General:  alert, well-developed, and well-nourished.   Head:  normocephalic and atraumatic.   Neck:  supple.   Lungs:  normal breath sounds, no crackles, and no wheezes.  improved from last exam  Heart:  normal rate and regular rhythm.   Msk:  Left knee: + effusion + pain over medial and lat joint space (medial greater) no deformity no crepitus neg mcmurray neg ant drawer   Extremities:  1+ left pedal edema and 1+ right pedal edema.   Neurologic:  alert & oriented X3 and cranial nerves II-XII intact.   Psych:  normally interactive.      Impression & Recommendations:  Problem # 1:  HYPERTENSION (ICD-401.9)  add  hctz check bmet in 2 weeks  Orders: 2 D Echo (2 D Echo)  Her updated medication list for this problem includes:    Hydrochlorothiazide 25 Mg Tabs (Hydrochlorothiazide) .Marland Kitchen... Take 1/2 tab once daily for blood pressure  Problem # 2:  EDEMA (ICD-782.3)  add hctz as above check echo with her h/o PE  Orders: 2 D Echo (2 D Echo)  Her updated medication list for this problem includes:    Hydrochlorothiazide 25 Mg Tabs (Hydrochlorothiazide) .Marland Kitchen... Take 1/2 tab once daily for blood pressure  Problem # 3:  HEMOCCULT POSITIVE STOOL (ICD-578.1)  repeat stool cards if +, send for EGD and colo  Orders: T-Hemoccult Cards-Multiple (82270) T-CBC w/Diff ([address]-60454)  Problem # 4:  ANEMIA-IRON DEFICIENCY (ICD-280.9) repeat cbc  Her updated medication list for this problem includes:    Ferrous Sulfate 325 (65 Fe) Mg Tabs (Ferrous sulfate) .Marland Kitchen... Take 1 tablet by mouth two times a day  Orders: T-Hemoccult Cards-Multiple (82270) T-CBC w/Diff (09811-91478)  Problem # 5:  PULMONARY EMBOLISM (ICD-415.19)  complete 6-12 mos coumadin repeat Prot C and S 2 mos after cessation  Her updated medication list for this problem includes:    Coumadin 10 Mg Tabs (Warfarin sodium) .Marland Kitchen... 1/2 tablet once daily    Warfarin Sodium 2.5 Mg Tabs (Warfarin sodium) .Marland Kitchen... Take with other other (coumadin) warfarin on sat. and sun. only to equal 7.5 mg on sat. and sun.  Orders: T-Protime, Auto (29562-13086) 2 D Echo (2 D Echo)  Problem # 6:  COUMADIN THERAPY (ICD-V58.61)  check inr  Orders: T-Protime, Auto ([id number])  Problem # 7:  COPD (ICD-496) Assessment: Improved  continue current meds  Her updated medication list for this problem includes:    Proventil Hfa 108 (90 Base) Mcg/act Aers (Albuterol sulfate) .Marland Kitchen..Marland Kitchen Two puffs every day as needed    Advair Diskus 250-50 Mcg/dose Aepb (Fluticasone-salmeterol) .Marland Kitchen... 1 puff two times a day  Orders: 2 D Echo (2 D Echo)  Problem # 8:  KNEE PAIN, LEFT  (ICD-719.46)  ? if she has a torn meniscus good have bloody effusion with coumadin I am not real comfortable putting her on NSAIDs for tx with coumadin therapy also, I am not comfortable draining her knee while on coumadin d/w Dr. Delrae Alfred [facility] to see if they can eval for meniscal tear, etc in the office  Orders: Sports Medicine (Sports Med)  Problem # 9:  POSITIVE PPD (ICD-795.5) CXR ok refer to Health Dept  Complete Medication List: 1)  Coumadin 10 Mg Tabs (Warfarin sodium) .... 1/2 tablet once daily 2)  Proventil  Hfa 108 (90 Base) Mcg/act Aers (Albuterol sulfate) .... Two puffs every day as needed 3)  Advair Diskus 250-50 Mcg/dose Aepb (Fluticasone-salmeterol) .Marland Kitchen.. 1 puff two times a day 4)  Ferrous Sulfate 325 (65 Fe) Mg Tabs (Ferrous sulfate) .... Take 1 tablet by mouth two times a day 5)  Pepcid 20 Mg Tabs (Famotidine) .... Take 1 tablet by mouth two times a day 6)  Warfarin Sodium 2.5 Mg Tabs (Warfarin sodium) .... Take with other other (coumadin) warfarin on sat. and sun. only to equal 7.5 mg on sat. and sun. 7)  Hydrochlorothiazide 25 Mg Tabs (Hydrochlorothiazide) .... Take 1/2 tab once daily for blood pressure  Patient Instructions: 1)  Refer to the Health Department for positive PPD.  Send records and CXR results. 2)  Please schedule a follow-up appointment in 2 weeks with nurse for BP check and BMET (401.1). 3)  Please schedule a follow-up appointment in 2 months with Rodrecus Belsky for blood pressure and COPD.  4)  You can take Tylenol (Acetaminophen) 500 mg 1-2 tabs by mouth every 6 hours as needed. 5)  You many want to get a knee brace to wear. 6)  I am referring you to the [facility] at [facility] so they can evaluate your knee.  If you do not get a call within 2 weeks, call me back. Prescriptions: HYDROCHLOROTHIAZIDE 25 MG TABS (HYDROCHLOROTHIAZIDE) Take 1/2 tab once daily for blood pressure  #15 x 5   Entered and Authorized by:    Tereso Newcomer PA-C   Signed by:   Tereso Newcomer PA-C on 09/05/2009   Method used:   Print then Give to Patient   RxID:   [id number]

## 2010-03-13 NOTE — Letter (Signed)
Summary: *HSN Results Follow up  [facility]  [address] [place], [place] [address]   Phone: [phone]  Fax: [phone]      12/19/2009   [facility] [place] [address] [place], [place]  [address]   Dear  Ms. Violet Vandervort,                            ____S.Drinkard,FNP   ____D. [place],FNP       ____B. McPherson,MD   ____V. Rankins,MD    __X__E. Kileigh Ortmann,MD    ____N. Daphine Deutscher, FNP  ____D. Reche Dixon, MD    ____K. Philipp Deputy, MD    ____Other     This letter is to inform you that your recent test(s):  _______Pap Smear    _______Lab Test     _______X-ray    _______ is within acceptable limits  _______ requires a medication change  _______ requires a follow-up lab visit  _______ requires a follow-up visit with your provider   Comments:  Echocardiogram was fine.       _________________________________________________________ If you have any questions, please contact our office                     Sincerely,  Julieanne Manson MD [facility]

## 2010-03-13 NOTE — Miscellaneous (Signed)
  Clinical Lists Changes  Observations: Added new observation of PAST MED HX: Acute Pulmonary Embolism (06/2009)   a.  coumadin started 07/03/2009   b.  hypercoag panel with low Prot C and S (done while on coumadin); Lupus anticoag. not detected   c.  patient will need 6-12 mos of coumadin; plan to repeat Prot C and S levels after off coumadin x 2 mos. LLL pneumonia 06/2009 COPD Anemia-iron deficiency Hypertension   a.  no meds in years GERD + PPD 08/2009 L Knee Meniscal Tear   a.  [facility] by [facility] 09/2009; placed in brace and f/u planned; poss. may heal on its own (10/30/2009 9:12)       Past History:  Past Medical History: Acute Pulmonary Embolism (06/2009)   a.  coumadin started 07/03/2009   b.  hypercoag panel with low Prot C and S (done while on coumadin); Lupus anticoag. not detected   c.  patient will need 6-12 mos of coumadin; plan to repeat Prot C and S levels after off coumadin x 2 mos. LLL pneumonia 06/2009 COPD Anemia-iron deficiency Hypertension   a.  no meds in years GERD + PPD 08/2009 L Knee Meniscal Tear   a.  [facility] 09/2009; placed in brace and f/u planned; poss. may heal on its own

## 2010-03-13 NOTE — Miscellaneous (Signed)
Summary: Hypercoag Panel d/w Heme.  Clinical Lists Changes  Problems: Assessed PULMONARY EMBOLISM as comment only - hypercoag panel done Prot C and S low, but she is on coumadin which will interfere with test results Lupus Anticoag not detected all other parameters normal  d/w Dr. Shirline Frees at cancer center he suggested 6-12 mos of coumadin therapy repeat Prot C and S 2 mos after finishing coumadin  Her updated medication list for this problem includes:    Coumadin 10 Mg Tabs (Warfarin sodium) .Marland Kitchen... 1/2 tablet once daily    Warfarin Sodium 2.5 Mg Tabs (Warfarin sodium) .Marland Kitchen... Take with other other (coumadin) warfarin on sat. and sun. only to equal 7.5 mg on sat. and sun.  - Signed Observations: Added new observation of PAST MED HX: Acute Pulmonary Embolism (06/2009)   a.  coumadin started 07/03/2009   b.  hypercoag panel with low Prot C and S (done while on coumadin); Lupus anticoag. not detected   c.  patient will need 6-12 mos of coumadin; plan to repeat Prot C and S levels after off coumadin x 2 mos. LLL pneumonia 06/2009 COPD Anemia-iron deficiency Hypertension   a.  no meds in years GERD  (08/19/2009 10:33)       Past History:  Past Medical History: Acute Pulmonary Embolism (06/2009)   a.  coumadin started 07/03/2009   b.  hypercoag panel with low Prot C and S (done while on coumadin); Lupus anticoag. not detected   c.  patient will need 6-12 mos of coumadin; plan to repeat Prot C and S levels after off coumadin x 2 mos. LLL pneumonia 06/2009 COPD Anemia-iron deficiency Hypertension   a.  no meds in years GERD   Impression & Recommendations:  Problem # 1:  PULMONARY EMBOLISM (ICD-415.19) hypercoag panel done Prot C and S low, but she is on coumadin which will interfere with test results Lupus Anticoag not detected all other parameters normal  d/w Dr. Shirline Frees at cancer center he suggested 6-12 mos of coumadin therapy repeat Prot C and S 2 mos after finishing  coumadin  Her updated medication list for this problem includes:    Coumadin 10 Mg Tabs (Warfarin sodium) .Marland Kitchen... 1/2 tablet once daily    Warfarin Sodium 2.5 Mg Tabs (Warfarin sodium) .Marland Kitchen... Take with other other (coumadin) warfarin on sat. and sun. only to equal 7.5 mg on sat. and sun.  Complete Medication List: 1)  Coumadin 10 Mg Tabs (Warfarin sodium) .... 1/2 tablet once daily 2)  Prednisone 10 Mg Tabs (Prednisone) .... Two tabs three times a day 3)  Proventil Hfa 108 (90 Base) Mcg/act Aers (Albuterol sulfate) .... Two puffs every day as needed 4)  Advair Diskus 250-50 Mcg/dose Aepb (Fluticasone-salmeterol) .Marland Kitchen.. 1 puff two times a day 5)  Ferrous Sulfate 325 (65 Fe) Mg Tabs (Ferrous sulfate) .... Take 1 tablet by mouth two times a day 6)  Pepcid 20 Mg Tabs (Famotidine) .... Take 1 tablet by mouth two times a day 7)  Warfarin Sodium 2.5 Mg Tabs (Warfarin sodium) .... Take with other other (coumadin) warfarin on sat. and sun. only to equal 7.5 mg on sat. and sun.

## 2010-03-13 NOTE — Progress Notes (Signed)
Summary: Coumadin; Glucose  Phone Note Outgoing Call   Summary of Call: Kidney fxn ok. Glucose high.  Check A1C with repeat BMET in 2 weeks. INR too low. Change Coumadin to: 10 mg on Tues, Thurs, Sat, Sun 5 mg on Mon, Wed, Fri. Repeat INR in 2 weeks. Notify [facility] pharmacy of dose adjustment and have her do PT/INR there in 2 weeks.  Initial call taken by: Brynda Rim,  September 20, 2009 9:31 AM  Follow-up for Phone Call        Left message on answering machine for pt to call back.Marland KitchenMarland KitchenMarland KitchenArmenia Shannon  September 23, 2009 12:28 PM    spoke with pt and she has appt on sept 2 for labs and aware of changes with med.... [name]  September 23, 2009 4:50 PM     New/Updated Medications: COUMADIN 10 MG TABS (WARFARIN SODIUM) 1 by mouth on Tues, Thurs, Sat, Sun COUMADIN 5 MG TABS (WARFARIN SODIUM) 1 by mouth on Mon, Wed, Fri    Anticoagulation Management History:      Positive risk factors for bleeding include history of GI bleeding.  Negative risk factors for bleeding include an age less than 50 years old.  The bleeding index is 'intermediate risk'.  Positive CHADS2 values include History of HTN.  Negative CHADS2 values include Age > 53 years old.  Her last INR was 1.87.     Anticoagulation Management Assessment/Plan:      The patient's current anticoagulation dose is Coumadin 10 mg tabs: 1 by mouth on Tues, Thurs, Sat, Sun, Coumadin 5 mg tabs: 1 by mouth on Mon, Wed, Fri.  The next INR is due 10/04/2009.         Prior Anticoagulation Instructions: The patient's dosage of coumadin will be increased.  The new dosage includes: Take 10 mg x 1 dose, then change to 5 mg once daily except take 7.5 mg on Sat and Sunday.  Current Anticoagulation Instructions: The patient's dosage of coumadin will be increased.  The new dosage includes: 10 mg on Tu, Th, Sa, Su and 5 mg on MWF

## 2010-03-13 NOTE — Progress Notes (Signed)
  Phone Note Outgoing Call   Summary of Call: Please arrange f/u in coumadin clinic on Tues or Wed at [facility]. Tell the pharmacy that I had her take coumadin 10 mg on Fri and Sat x 1. Then, I changed her dose to 10 mg MWF and 5 mg all other days. Her INR was 1.10 Initial call taken by: Brynda Rim,  September 06, 2009 4:59 PM  Follow-up for Phone Call        PHARMACY IS AWARE OF MED CHANGES....  Follow-up by: [name],  September 09, 2009 9:38 AM  Additional Follow-up for Phone Call Additional follow up Details #1::        appt scheduled Additional Follow-up by: [name],  September 09, 2009 1:33 PM

## 2010-03-18 ENCOUNTER — Telehealth (INDEPENDENT_AMBULATORY_CARE_PROVIDER_SITE_OTHER): Payer: Self-pay | Admitting: Nurse Practitioner

## 2010-03-27 NOTE — Progress Notes (Signed)
Summary: Med Refills  Phone Note Refill Request Call back at [phone] Message from:  Patient on March 18, 2010 4:13 PM  Refills Requested: Medication #1:  ADVAIR DISKUS 250-50 MCG/DOSE AEPB 1 puff two times a day  Medication #2:  PROVENTIL HFA 108 (90 BASE) MCG/ACT AERS 1-2 puffs every 4-6 hours as needed  Medication #3:  HYDROCHLOROTHIAZIDE 25 MG TABS Take 1/2 tab once daily for blood pressure  Medication #4:  COUMADIN 10 MG TABS Take 1 tablet by mouth once a day except 1/2 (5 mg) on Thursday and Saturday pt says she needs refill on all meds.... pt says she wants to know if we have any samples of advair, proventil or symicort... [facility]... please call pt to let her know  Initial call taken by: [name],  March 18, 2010 4:09 PM  Follow-up for Phone Call        Last seen 10/2009.  Refill meds per protocol?  Give samples if available? Follow-up by: Dutch Quint RN,  March 18, 2010 4:17 PM  Additional Follow-up for Phone Call Additional follow up Details #1::        Coumadin is prescribed [facility] - no evidence of her PT/INR checks in her chart.  Need to contact them for dosing info Ok to refill HCTZ and ventolin if samples of advair available ok to give to pt but still sent refill to [facility] pharmacy.  They will order it through pt assistance program for pt  Additional Follow-up by: Lehman Prom FNP,  March 19, 2010 11:50 AM    Additional Follow-up for Phone Call Additional follow up Details #2::    Left message with female for pt. to return call.  Has not received Coumadin from [facility] since 09/2009, not being dosed by [facility]. Other refills completed.  No samples available.  Dutch Quint RN  March 19, 2010 12:25 PM  Pt. in office -- reviewed with Jesse Fall -- no need for refills on Coumadin -- was receiving prophylactically after PE.  Pt. stopped coumadin therapy -- did not f/u with labs/meds since 09/2009.  Pt. advised of same and need for f/u  appt. with provider for chronic meds, HTN.  Dutch Quint RN  March 20, 2010 10:55 AM   Prescriptions: HYDROCHLOROTHIAZIDE 25 MG TABS (HYDROCHLOROTHIAZIDE) Take 1/2 tab once daily for blood pressure  #15 x 3   Entered by:   Dutch Quint RN   Authorized by:   Lehman Prom FNP   Signed by:   Dutch Quint RN on 03/19/2010   Method used:   Faxed to ...       [facility] (retail)       [address] [place], [place]  [address]       Ph: [phone]       Fax: [phone]   RxID:   [id number] ADVAIR DISKUS 250-50 MCG/DOSE AEPB (FLUTICASONE-SALMETEROL) 1 puff two times a day  #1 x 3   Entered by:   Dutch Quint RN   Authorized by:   Lehman Prom FNP   Signed by:   Dutch Quint RN on 03/19/2010   Method used:   Faxed to ...       [facility] (retail)       [address] [place], [place]  [address]       Ph: [phone]  Fax: [phone]   RxID:   [id number] PROVENTIL HFA 108 (90 BASE) MCG/ACT AERS (ALBUTEROL SULFATE) 1-2 puffs every 4-6 hours as needed  #1 x 3   Entered by:   Dutch Quint RN   Authorized by:   Lehman Prom FNP   Signed by:   Dutch Quint RN on 03/19/2010   Method used:   Faxed to ...       [facility] (retail)       [address] [place], [place]  [address]       Ph: [phone]       Fax: [phone]   RxID:   [id number]

## 2010-04-28 LAB — CULTURE, BLOOD (ROUTINE X 2): Culture: NO GROWTH

## 2010-04-28 LAB — URINE CULTURE: Colony Count: >=100000

## 2010-04-28 LAB — DIFFERENTIAL
Basophils Absolute: 0 10*3/uL (ref 0.0–0.1)
Basophils Absolute: 0 10*3/uL (ref 0.0–0.1)
Basophils Relative: 0 % (ref 0–1)
Eosinophils Absolute: 0 10*3/uL (ref 0.0–0.7)
Eosinophils Absolute: 0 10*3/uL (ref 0.0–0.7)
Eosinophils Relative: 0 % (ref 0–5)
Eosinophils Relative: 0 % (ref 0–5)
Lymphocytes Relative: 10 % — ABNORMAL LOW (ref 12–46)
Lymphocytes Relative: 5 % — ABNORMAL LOW (ref 12–46)
Lymphs Abs: 0.7 10*3/uL (ref 0.7–4.0)
Lymphs Abs: 1.1 10*3/uL (ref 0.7–4.0)
Monocytes Relative: 4 % (ref 3–12)
Neutro Abs: 9.6 10*3/uL — ABNORMAL HIGH (ref 1.7–7.7)
Neutrophils Relative %: 86 % — ABNORMAL HIGH (ref 43–77)

## 2010-04-28 LAB — GLUCOSE, CAPILLARY
Glucose-Capillary: 104 mg/dL — ABNORMAL HIGH (ref 70–99)
Glucose-Capillary: 123 mg/dL — ABNORMAL HIGH (ref 70–99)
Glucose-Capillary: 135 mg/dL — ABNORMAL HIGH (ref 70–99)
Glucose-Capillary: 137 mg/dL — ABNORMAL HIGH (ref 70–99)
Glucose-Capillary: 141 mg/dL — ABNORMAL HIGH (ref 70–99)
Glucose-Capillary: 145 mg/dL — ABNORMAL HIGH (ref 70–99)
Glucose-Capillary: 161 mg/dL — ABNORMAL HIGH (ref 70–99)
Glucose-Capillary: 174 mg/dL — ABNORMAL HIGH (ref 70–99)
Glucose-Capillary: 177 mg/dL — ABNORMAL HIGH (ref 70–99)
Glucose-Capillary: 202 mg/dL — ABNORMAL HIGH (ref 70–99)
Glucose-Capillary: 81 mg/dL (ref 70–99)
Glucose-Capillary: 89 mg/dL (ref 70–99)

## 2010-04-28 LAB — CBC
HCT: 27.1 % — ABNORMAL LOW (ref 36.0–46.0)
HCT: 27.5 % — ABNORMAL LOW (ref 36.0–46.0)
HCT: 27.9 % — ABNORMAL LOW (ref 36.0–46.0)
HCT: 28 % — ABNORMAL LOW (ref 36.0–46.0)
HCT: 28.2 % — ABNORMAL LOW (ref 36.0–46.0)
Hemoglobin: 9.2 g/dL — ABNORMAL LOW (ref 12.0–15.0)
Hemoglobin: 9.2 g/dL — ABNORMAL LOW (ref 12.0–15.0)
Hemoglobin: 9.3 g/dL — ABNORMAL LOW (ref 12.0–15.0)
MCHC: 32.8 g/dL (ref 30.0–36.0)
MCHC: 32.8 g/dL (ref 30.0–36.0)
MCV: 100.3 fL — ABNORMAL HIGH (ref 78.0–100.0)
MCV: 100.9 fL — ABNORMAL HIGH (ref 78.0–100.0)
MCV: 101.8 fL — ABNORMAL HIGH (ref 78.0–100.0)
Platelets: 159 10*3/uL (ref 150–400)
Platelets: 204 10*3/uL (ref 150–400)
Platelets: 264 10*3/uL (ref 150–400)
Platelets: 304 10*3/uL (ref 150–400)
Platelets: 343 10*3/uL (ref 150–400)
RBC: 2.77 MIL/uL — ABNORMAL LOW (ref 3.87–5.11)
RBC: 2.8 MIL/uL — ABNORMAL LOW (ref 3.87–5.11)
RBC: 3 MIL/uL — ABNORMAL LOW (ref 3.87–5.11)
RDW: 15.6 % — ABNORMAL HIGH (ref 11.5–15.5)
RDW: 15.6 % — ABNORMAL HIGH (ref 11.5–15.5)
RDW: 15.8 % — ABNORMAL HIGH (ref 11.5–15.5)
RDW: 16.4 % — ABNORMAL HIGH (ref 11.5–15.5)
WBC: 11.1 10*3/uL — ABNORMAL HIGH (ref 4.0–10.5)
WBC: 11.1 10*3/uL — ABNORMAL HIGH (ref 4.0–10.5)
WBC: 11.8 10*3/uL — ABNORMAL HIGH (ref 4.0–10.5)
WBC: 13.6 10*3/uL — ABNORMAL HIGH (ref 4.0–10.5)
WBC: 9.9 10*3/uL (ref 4.0–10.5)

## 2010-04-28 LAB — URINALYSIS, ROUTINE W REFLEX MICROSCOPIC
Bilirubin Urine: NEGATIVE
Glucose, UA: NEGATIVE mg/dL
Ketones, ur: 15 mg/dL — AB
Nitrite: POSITIVE — AB

## 2010-04-28 LAB — BASIC METABOLIC PANEL
BUN: 4 mg/dL — ABNORMAL LOW (ref 6–23)
Calcium: 8.6 mg/dL (ref 8.4–10.5)
Calcium: 9.1 mg/dL (ref 8.4–10.5)
Chloride: 99 mEq/L (ref 96–112)
Creatinine, Ser: 0.75 mg/dL (ref 0.4–1.2)
GFR calc Af Amer: >60 mL/min (ref >60)
GFR calc non Af Amer: >60 mL/min (ref >60)
GFR calc non Af Amer: >60 mL/min (ref >60)
Glucose, Bld: 80 mg/dL (ref 70–99)
Potassium: 3.7 mEq/L (ref 3.5–5.1)
Sodium: 135 mEq/L (ref 135–145)
Sodium: 138 mEq/L (ref 135–145)

## 2010-04-28 LAB — RETICULOCYTES
RBC.: 3.06 MIL/uL — ABNORMAL LOW (ref 3.87–5.11)
Retic Count, Absolute: 113.2 10*3/uL (ref 19.0–186.0)

## 2010-04-28 LAB — BLOOD GAS, ARTERIAL
O2 Saturation: 90.2 %
Patient temperature: 98.6
TCO2: 19.2 mmol/L (ref 0–100)
pCO2 arterial: 33.1 mmHg — ABNORMAL LOW (ref 35.0–45.0)
pO2, Arterial: 61.8 mmHg — ABNORMAL LOW (ref 80.0–100.0)

## 2010-04-28 LAB — LEGIONELLA ANTIGEN, URINE: Legionella Antigen, Urine: NEGATIVE

## 2010-04-28 LAB — PROTIME-INR
INR: 1.13 (ref 0.00–1.49)
INR: 1.19 (ref 0.00–1.49)
INR: 1.2 (ref 0.00–1.49)

## 2010-04-28 LAB — CARDIAC PANEL(CRET KIN+CKTOT+MB+TROPI)
Relative Index: INVALID (ref 0.0–2.5)
Total CK: 16 U/L (ref 7–177)
Total CK: 16 U/L (ref 7–177)

## 2010-04-28 LAB — COMPREHENSIVE METABOLIC PANEL
ALT: 29 U/L (ref 0–35)
AST: 42 U/L — ABNORMAL HIGH (ref 0–37)
CO2: 25 mEq/L (ref 19–32)
Chloride: 106 mEq/L (ref 96–112)
GFR calc Af Amer: >60 mL/min (ref >60)
GFR calc non Af Amer: >60 mL/min (ref >60)
Potassium: 3.9 mEq/L (ref 3.5–5.1)
Sodium: 139 mEq/L (ref 135–145)
Total Bilirubin: 1.4 mg/dL — ABNORMAL HIGH (ref 0.3–1.2)

## 2010-04-28 LAB — FERRITIN: Ferritin: 447 ng/mL — ABNORMAL HIGH (ref 10–291)

## 2010-04-28 LAB — HEMOGLOBIN A1C: Hgb A1c MFr Bld: 5 % (ref <5.7)

## 2010-04-28 LAB — FOLATE RBC: RBC Folate: 320 ng/mL (ref 180–600)

## 2010-04-28 LAB — HEPARIN LEVEL (UNFRACTIONATED): Heparin Unfractionated: 0.4 IU/mL (ref 0.30–0.70)

## 2010-04-28 LAB — IRON AND TIBC: Iron: 19 ug/dL — ABNORMAL LOW (ref 42–135)

## 2010-04-28 LAB — URINE MICROSCOPIC-ADD ON

## 2010-04-28 LAB — D-DIMER, QUANTITATIVE: D-Dimer, Quant: 1.94 ug/mL-FEU — ABNORMAL HIGH (ref 0.00–0.48)

## 2010-04-28 LAB — RAPID URINE DRUG SCREEN, HOSP PERFORMED: Opiates: NOT DETECTED

## 2010-04-28 LAB — APTT: aPTT: 35 seconds (ref 24–37)

## 2010-06-27 NOTE — H&P (Signed)
NAMEKAIRAH, LEONI            ACCOUNT NO.:  [id number]   MEDICAL RECORD NO.:  [id number]          PATIENT TYPE:  EMS   LOCATION:  ED                           FACILITY:  [facility]   PHYSICIAN:  Mobolaji B. Bakare, M.D.DATE OF BIRTH:  Aug 17, 1960   DATE OF ADMISSION:  03/16/2006  DATE OF DISCHARGE:                              HISTORY & PHYSICAL   PRIMARY CARE PHYSICIAN:  [facility].   CHIEF COMPLAINT:  Shortness of breath which started yesterday.   HISTORY OF PRESENT ILLNESS:  Ms. Farinas is a 50 year old, African-  American female who started having upper airway congestion yesterday.  She developed headache, body aches with accompanying chills.  She  subsequently developed cough and shortness of breath.  Cough is  productive of sputum.  She decided to come to the emergency room today.   She developed vomiting, abdominal pain and diarrhea.  Today, she has  moved to watery stools about three times and vomited about 2-3 times.  Abdominal pain is not constant.  The patient feels weak and unable to  keep anything down.  She had a chest x-ray which showed bilateral  infiltrate compatible with acute bilateral pneumonia.  The patient  received Zithromax and Rocephin in the emergency room.  Blood cultures  have been drawn.  She will be admitted for further treatment.   REVIEW OF SYSTEMS:  She denies dysuria or foul smelling urine.  No  change in frequency or micturition.  Has no pleuritic chest pain.  No  change in vision.  The rest of the review of systems as in the HPI.   PAST MEDICAL HISTORY:  1. Hypertension.  2. Bronchitis.   PAST SURGICAL HISTORY:  Debridement of injury to right foot.   CURRENT MEDICATIONS:  None.   ALLERGIES:  No known drug allergies.   SOCIAL HISTORY:  She smokes one pack of cigarettes per day and she has  been smoking for about 20 years.  She occasionally drinks alcohol.  She  used crack/cocaine with last use 3 days ago.   FAMILY HISTORY:  Father is  deceased.  He passed away from gunshot  injury.  Mother has diabetes mellitus.   PHYSICAL EXAMINATION:  VITAL SIGNS:  Temperature 100.8 rectally, blood  pressure 141/100, pulse 126, respirations 40 and went down eventually  down to 22, O2 saturations 97% on room air.  GENERAL:  The patient is tachypneic, acutely ill-looking, not pale,  anicteric.  NECK:  No elevated JVD, no carotid bruit, no cervical lymphadenopathy.  LUNGS:  Bilateral rhonchi with scattered crackles at lung bases.  CARDIAC:  S1, S2, regular tachycardia, no discernable murmur.  ABDOMEN:  Soft, nontender, bowel sounds present.  EXTREMITIES:  No pedal edema or calf tenderness.  Dorsalis pedis pulses  palpable bilaterally.  NEUROLOGIC:  Nonfocal neurological deficits.   LABORATORY DATA AND X-RAY FINDINGS:  Sodium 136, potassium 4, chloride  100, bicarb 27, glucose 98, BUN 8, creatinine 0.75.  White cell 21.8,  hemoglobin 13.8, hematocrit 40.9, platelets 322.  ABG showed pH of 7.5,  pCO2 29, pO2 52, bicarb 23, O2 saturations 88.5% on room air.   ASSESSMENT/PLAN:  1. Community-acquired pneumonia.  The patient will be admitted to      telemetry floor.  Continue intravenous Zithromax 500 mg daily,      Rocephin 1 g intravenous daily, sputum culture and Gram stain.      Blood cultures have been drawn.  Will give intravenous fluids with      normal saline at 150 mL per hour.  Will check influenza antigen      screen.  2. Nausea, vomiting and diarrhea, probably related to underlying      infection.  Will treat symptomatically.  Send stool for culture,      fecalith and Clostridium difficile, intravenous fluid hydration,      Phenergan 12.5 mg intravenous q.4 h. p.r.n., Dilaudid 0.5 mg to 1      mg intravenous q.4 h. for pain.  3. Dehydration.  Will rehydrate as mentioned above.  4. Tobacco abuse.  Will give nicotine patch 21 mg daily and offer      tobacco cessation carefully.  5. Crack-cocaine abuse.  Will offer substance  abuse counseling.  6. Hypertension.  The patient is noncompliant with her medication.      The patient will start hydrochlorothiazide 25 mg daily.      Mobolaji B. Corky Downs, M.D.  Electronically Signed     MBB/MEDQ  D:  03/16/2006  T:  03/16/2006  Job:  [id number]

## 2010-06-27 NOTE — Discharge Summary (Signed)
NAMETIAJA, HAGAN            ACCOUNT NO.:  [id number]   MEDICAL RECORD NO.:  [id number]          PATIENT TYPE:  INP   LOCATION:  1429                         FACILITY:  [facility]   PHYSICIAN:  Lonia Blood, M.D.       DATE OF BIRTH:  October 08, 1960   DATE OF ADMISSION:  03/16/2006  DATE OF DISCHARGE:  03/22/2006                               DISCHARGE SUMMARY   DISCHARGE DIAGNOSIS:  1. Pneumonia.  2. Hypertension.  3. Cocaine use.  4. Tobacco abuse.  5. Dehydration, resolved.  6. An episode of emesis of  unknown etiology, resolved.   DISCHARGE MEDICATIONS:  1. Avelox 400 mg daily for three days.  2. Doxycycline 100 mg daily for four days.  3. Dyazide 1 tablet daily.  4. Albuterol inhaler 1 puff four times a day.   CONDITION ON DISCHARGE:  The patient was discharged in good condition.  At the time of discharge, she was instructed to follow up with [facility] for her medical needs.   PROCEDURE PERFORMED:  March 16, 2006, the patient underwent a chest x-  ray with findings of bilateral air space disease consistent with  pneumonia.   CONSULTATIONS:  No consultations were obtained.   HISTORY AND PHYSICAL:  For admission history and physical, refer to the  dictated H&P done by Dr. Corky Downs, March 16, 2006.   HOSPITAL COURSE:  1. Right upper lobe pneumonia.  Ms. Saltz was admitted to the acute      care unit of [facility] and she was started on      intravenous antibiotics using Zithromax and Rocephin.  The patient      responded well to antibiotics and she became afebrile.  From      February 7 until March 22, 2006, the patient did not have any      recurrent fever.  In fact, looking back at the whole      hospitalization, the patient did not have any fever.  Throughout      the hospitalization, Ms. Hatfield, of course, improved and her      oxygen requirements became less and less.  She actually was able to      ambulate with assistance and,  of note, she was continued on      Zithromax and Rocephin intravenously throughout the      hospitalization.  By March 22, 2006, the patient had already      completed one week of intravenous antibiotic and it was felt safe      to discharge her home on oral antibiotics.  The choice of oral      antibiotic was Avelox for three days provided by the hospital and      another four days of Doxycycline to complete a two week course of      antibiotics.   1. Hypertension.  The patient was recommended to start Dyazide and      follow up with [facility] for further adjustments in her      medications.   1. Cocaine use.  The patient was counseled about the  dangers of using      cocaine.      Lonia Blood, M.D.  Electronically Signed     SL/MEDQ  D:  03/23/2006  T:  03/23/2006  Job:  [id number]   cc:   [facility]

## 2011-09-18 ENCOUNTER — Emergency Department (HOSPITAL_COMMUNITY): Payer: Self-pay

## 2011-09-18 ENCOUNTER — Emergency Department (HOSPITAL_COMMUNITY)
Admission: EM | Admit: 2011-09-18 | Discharge: 2011-09-19 | Disposition: A | Discharge status: Home / Self Care | Payer: Self-pay | Attending: Emergency Medicine | Admitting: Emergency Medicine

## 2011-09-18 ENCOUNTER — Encounter (HOSPITAL_COMMUNITY): Payer: Self-pay | Admitting: Emergency Medicine

## 2011-09-18 DIAGNOSIS — J40 Bronchitis, not specified as acute or chronic: Secondary | ICD-10-CM | POA: Insufficient documentation

## 2011-09-18 DIAGNOSIS — I10 Essential (primary) hypertension: Secondary | ICD-10-CM | POA: Insufficient documentation

## 2011-09-18 DIAGNOSIS — F17200 Nicotine dependence, unspecified, uncomplicated: Secondary | ICD-10-CM | POA: Insufficient documentation

## 2011-09-18 DIAGNOSIS — J069 Acute upper respiratory infection, unspecified: Secondary | ICD-10-CM | POA: Insufficient documentation

## 2011-09-18 DIAGNOSIS — Z8739 Personal history of other diseases of the musculoskeletal system and connective tissue: Secondary | ICD-10-CM | POA: Insufficient documentation

## 2011-09-18 DIAGNOSIS — Z7982 Long term (current) use of aspirin: Secondary | ICD-10-CM | POA: Insufficient documentation

## 2011-09-18 DIAGNOSIS — J45909 Unspecified asthma, uncomplicated: Secondary | ICD-10-CM | POA: Insufficient documentation

## 2011-09-18 HISTORY — DX: Unspecified osteoarthritis, unspecified site: M19.90

## 2011-09-18 HISTORY — DX: Other pulmonary embolism without acute cor pulmonale: I26.99

## 2011-09-18 HISTORY — DX: Essential (primary) hypertension: I10

## 2011-09-18 HISTORY — DX: Accidental discharge from unspecified firearms or gun, initial encounter: W34.00XA

## 2011-09-18 HISTORY — DX: Unspecified asthma, uncomplicated: J45.909

## 2011-09-18 LAB — CBC WITH DIFFERENTIAL/PLATELET
Basophils Absolute: 0 10*3/uL (ref 0.0–0.1)
Basophils Relative: 1 % (ref 0–1)
Eosinophils Relative: 1 % (ref 0–5)
HCT: 38.5 % (ref 36.0–46.0)
MCHC: 34.8 g/dL (ref 30.0–36.0)
Monocytes Absolute: 0.4 10*3/uL (ref 0.1–1.0)
Neutro Abs: 1.9 10*3/uL (ref 1.7–7.7)
Platelets: 228 10*3/uL (ref 150–400)
RDW: 14.3 % (ref 11.5–15.5)
WBC: 5.2 10*3/uL (ref 4.0–10.5)

## 2011-09-18 LAB — POCT I-STAT TROPONIN I: Troponin i, poc: 0 ng/mL (ref 0.00–0.08)

## 2011-09-18 LAB — BASIC METABOLIC PANEL
Calcium: 9.5 mg/dL (ref 8.4–10.5)
Creatinine, Ser: 0.62 mg/dL (ref 0.50–1.10)
GFR calc Af Amer: >90 mL/min (ref >90)
Sodium: 136 mEq/L (ref 135–145)

## 2011-09-18 MED ORDER — ALBUTEROL SULFATE (5 MG/ML) 0.5% IN NEBU
INHALATION_SOLUTION | RESPIRATORY_TRACT | Status: AC
  Administered 2011-09-18: 5 mg via RESPIRATORY_TRACT
  Filled 2011-09-18: qty 1

## 2011-09-18 MED ORDER — ALBUTEROL SULFATE (5 MG/ML) 0.5% IN NEBU
5.0000 mg | INHALATION_SOLUTION | Freq: Once | RESPIRATORY_TRACT | Status: AC
Start: 2011-09-18 — End: 2011-09-18
  Administered 2011-09-18: 5 mg via RESPIRATORY_TRACT

## 2011-09-18 MED ORDER — HYDROCODONE-ACETAMINOPHEN 7.5-500 MG/15ML PO SOLN: 15.0000 mL | Freq: Four times a day (QID) | ORAL | Status: AC | PRN

## 2011-09-18 MED ORDER — HYDROCODONE-ACETAMINOPHEN 7.5-500 MG/15ML PO SOLN
10.0000 mL | Freq: Once | ORAL | Status: AC
  Administered 2011-09-18: 10 mL via ORAL
  Filled 2011-09-18: qty 15

## 2011-09-18 MED ORDER — AZITHROMYCIN 250 MG PO TABS: 250.0000 mg | ORAL_TABLET | Freq: Every day | ORAL | Status: AC

## 2011-09-18 MED ORDER — ALBUTEROL SULFATE HFA 108 (90 BASE) MCG/ACT IN AERS
2.0000 | INHALATION_SPRAY | RESPIRATORY_TRACT | Status: DC | PRN
  Administered 2011-09-19: 2 via RESPIRATORY_TRACT
  Filled 2011-09-18: qty 6.7

## 2011-09-18 NOTE — ED Notes (Signed)
Pt reports intermittent chest pain for a few days, today has been constant and progressively worse - pt also w/ non-productive cough and shortness of breath - pt w/ hx of bronchitis and PE. Pt also admits to x1 episode of n/v today. Pt anxious on arrival to room, exp wheezing and cough noted on assessment. Protocol initiated w/ 5mg  albuterol neb tx.

## 2011-09-18 NOTE — ED Provider Notes (Signed)
Medical screening examination/treatment/procedure(s) were performed by non-physician practitioner and as supervising physician I was immediately available for consultation/collaboration.  Cheri Guppy, MD 09/18/11 2337
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
## 2011-09-18 NOTE — ED Notes (Signed)
EKG given to EDP,Capprossi,MD.

## 2011-09-18 NOTE — ED Notes (Signed)
Pt states she feels short of breath and is having chest pain  Pt states she has been having pain for a while off and on but today for the past couple of hours it has been worse  Pt states pain is in her mid chest and on the right side and up in her neck describes as a nagging pain   Pt states she has high blood pressure but has not been on her medication for at least 2 months  Pt states she is also having lower abd pain, nausea and vomiting today, headache, and blurred vision

## 2011-09-19 NOTE — ED Notes (Signed)
Rx given x2 Pt ambulating independently w/ steady gait on d/c in no acute distress, A&Ox4. D/c instructions reviewed w/ pt and family - pt and family deny any further questions or concerns at present.  

## 2015-02-21 ENCOUNTER — Emergency Department (HOSPITAL_COMMUNITY): Payer: Self-pay

## 2015-02-21 ENCOUNTER — Emergency Department (HOSPITAL_COMMUNITY)
Admission: EM | Admit: 2015-02-21 | Discharge: 2015-02-21 | Disposition: A | Discharge status: Home / Self Care | Payer: Self-pay | Attending: Emergency Medicine | Admitting: Emergency Medicine

## 2015-02-21 ENCOUNTER — Encounter (HOSPITAL_COMMUNITY): Payer: Self-pay | Admitting: Nurse Practitioner

## 2015-02-21 DIAGNOSIS — Z7982 Long term (current) use of aspirin: Secondary | ICD-10-CM | POA: Insufficient documentation

## 2015-02-21 DIAGNOSIS — J45901 Unspecified asthma with (acute) exacerbation: Secondary | ICD-10-CM | POA: Insufficient documentation

## 2015-02-21 DIAGNOSIS — R630 Anorexia: Secondary | ICD-10-CM | POA: Insufficient documentation

## 2015-02-21 DIAGNOSIS — R000 Tachycardia, unspecified: Secondary | ICD-10-CM | POA: Insufficient documentation

## 2015-02-21 DIAGNOSIS — Z86711 Personal history of pulmonary embolism: Secondary | ICD-10-CM | POA: Insufficient documentation

## 2015-02-21 DIAGNOSIS — R531 Weakness: Secondary | ICD-10-CM | POA: Insufficient documentation

## 2015-02-21 DIAGNOSIS — R42 Dizziness and giddiness: Secondary | ICD-10-CM | POA: Insufficient documentation

## 2015-02-21 DIAGNOSIS — J111 Influenza due to unidentified influenza virus with other respiratory manifestations: Secondary | ICD-10-CM | POA: Insufficient documentation

## 2015-02-21 DIAGNOSIS — I10 Essential (primary) hypertension: Secondary | ICD-10-CM | POA: Insufficient documentation

## 2015-02-21 DIAGNOSIS — Z87828 Personal history of other (healed) physical injury and trauma: Secondary | ICD-10-CM | POA: Insufficient documentation

## 2015-02-21 DIAGNOSIS — F17210 Nicotine dependence, cigarettes, uncomplicated: Secondary | ICD-10-CM | POA: Insufficient documentation

## 2015-02-21 DIAGNOSIS — R69 Illness, unspecified: Secondary | ICD-10-CM

## 2015-02-21 DIAGNOSIS — M1990 Unspecified osteoarthritis, unspecified site: Secondary | ICD-10-CM | POA: Insufficient documentation

## 2015-02-21 DIAGNOSIS — N390 Urinary tract infection, site not specified: Secondary | ICD-10-CM | POA: Insufficient documentation

## 2015-02-21 LAB — URINE MICROSCOPIC-ADD ON

## 2015-02-21 LAB — CBC
HCT: 42.8 % (ref 36.0–46.0)
HEMOGLOBIN: 15.3 g/dL — AB (ref 12.0–15.0)
MCH: 33.3 pg (ref 26.0–34.0)
MCHC: 35.7 g/dL (ref 30.0–36.0)
MCV: 93.2 fL (ref 78.0–100.0)
Platelets: 122 10*3/uL — ABNORMAL LOW (ref 150–400)
RBC: 4.59 MIL/uL (ref 3.87–5.11)
RDW: 13.4 % (ref 11.5–15.5)
WBC: 5.7 10*3/uL (ref 4.0–10.5)

## 2015-02-21 LAB — BASIC METABOLIC PANEL
ANION GAP: 14 (ref 5–15)
BUN: 8 mg/dL (ref 6–20)
CHLORIDE: 92 mmol/L — AB (ref 101–111)
CO2: 23 mmol/L (ref 22–32)
Calcium: 9.2 mg/dL (ref 8.9–10.3)
Creatinine, Ser: 0.8 mg/dL (ref 0.44–1.00)
Glucose, Bld: 71 mg/dL (ref 65–99)
POTASSIUM: 3.9 mmol/L (ref 3.5–5.1)
SODIUM: 129 mmol/L — AB (ref 135–145)

## 2015-02-21 LAB — I-STAT TROPONIN, ED: Troponin i, poc: 0.01 ng/mL (ref 0.00–0.08)

## 2015-02-21 LAB — URINALYSIS, ROUTINE W REFLEX MICROSCOPIC
GLUCOSE, UA: NEGATIVE mg/dL
Ketones, ur: 15 mg/dL — AB
NITRITE: NEGATIVE
PH: 6 (ref 5.0–8.0)
Protein, ur: 30 mg/dL — AB
Specific Gravity, Urine: 1.042 — ABNORMAL HIGH (ref 1.005–1.030)

## 2015-02-21 MED ORDER — ALBUTEROL SULFATE (2.5 MG/3ML) 0.083% IN NEBU
5.0000 mg | INHALATION_SOLUTION | Freq: Once | RESPIRATORY_TRACT | Status: AC
  Administered 2015-02-21: 5 mg via RESPIRATORY_TRACT

## 2015-02-21 MED ORDER — ALBUTEROL SULFATE (2.5 MG/3ML) 0.083% IN NEBU
INHALATION_SOLUTION | RESPIRATORY_TRACT | Status: AC
  Filled 2015-02-21: qty 6

## 2015-02-21 MED ORDER — SODIUM CHLORIDE 0.9 % IV BOLUS (SEPSIS)
1000.0000 mL | Freq: Once | INTRAVENOUS | Status: AC
  Administered 2015-02-21: 1000 mL via INTRAVENOUS

## 2015-02-21 MED ORDER — CEPHALEXIN 250 MG PO CAPS
500.0000 mg | ORAL_CAPSULE | Freq: Once | ORAL | Status: AC
  Administered 2015-02-21: 500 mg via ORAL
  Filled 2015-02-21: qty 2

## 2015-02-21 MED ORDER — IOHEXOL 350 MG/ML SOLN
100.0000 mL | Freq: Once | INTRAVENOUS | Status: AC | PRN
  Administered 2015-02-21: 100 mL via INTRAVENOUS

## 2015-02-21 MED ORDER — KETOROLAC TROMETHAMINE 30 MG/ML IJ SOLN
30.0000 mg | Freq: Once | INTRAMUSCULAR | Status: AC
  Administered 2015-02-21: 30 mg via INTRAVENOUS
  Filled 2015-02-21: qty 1

## 2015-02-21 MED ORDER — CEPHALEXIN 500 MG PO CAPS: 500.0000 mg | ORAL_CAPSULE | Freq: Four times a day (QID) | ORAL | Status: DC

## 2015-02-21 MED ORDER — ACETAMINOPHEN 500 MG PO TABS
1000.0000 mg | ORAL_TABLET | Freq: Once | ORAL | Status: AC
  Administered 2015-02-21: 1000 mg via ORAL
  Filled 2015-02-21: qty 2

## 2015-02-21 MED ORDER — IBUPROFEN 800 MG PO TABS
800.0000 mg | ORAL_TABLET | Freq: Once | ORAL | Status: AC
  Administered 2015-02-21: 800 mg via ORAL
  Filled 2015-02-21: qty 1

## 2015-02-21 NOTE — ED Provider Notes (Signed)
Patient signed out to me 55 year old with likely viral illness: cough, congestion, fever. Neg chest xray. Vital signs have been stable, tachycardia with fever due to likely flu. Patient eating and tolerating fluids without issue. UA shows UTI. Will start treatment.  Patient ambulatory, will discharge home to follow up with PCP.   Tonianne Fine Randall An, MD 02/21/15 2355
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
## 2015-02-21 NOTE — ED Notes (Signed)
She c/o 4 day history weakness, nausea, body aches, SOB, not feeling well. She reports productive cough, diarrhea also. She is A&Ox4, mil labored breathing with exertion

## 2015-02-21 NOTE — Discharge Instructions (Signed)
Take 4 over the counter ibuprofen tablets 3 times a day or 2 over-the-counter naproxen tablets twice a day for pain. ° °Influenza, Adult °Influenza ("the flu") is a viral infection of the respiratory tract. It occurs more often in winter months because people spend more time in close contact with one another. Influenza can make you feel very sick. Influenza easily spreads from person to person (contagious). °CAUSES  °Influenza is caused by a virus that infects the respiratory tract. You can catch the virus by breathing in droplets from an infected person's cough or sneeze. You can also catch the virus by touching something that was recently contaminated with the virus and then touching your mouth, nose, or eyes. °RISKS AND COMPLICATIONS °You may be at risk for a more severe case of influenza if you smoke cigarettes, have diabetes, have chronic heart disease (such as heart failure) or lung disease (such as asthma), or if you have a weakened immune system. Elderly people and pregnant women are also at risk for more serious infections. The most common problem of influenza is a lung infection (pneumonia). Sometimes, this problem can require emergency medical care and may be life threatening. °SIGNS AND SYMPTOMS  °Symptoms typically last 4 to 10 days and may include: °· Fever. °· Chills. °· Headache, body aches, and muscle aches. °· Sore throat. °· Chest discomfort and cough. °· Poor appetite. °· Weakness or feeling tired. °· Dizziness. °· Nausea or vomiting. °DIAGNOSIS  °Diagnosis of influenza is often made based on your history and a physical exam. A nose or throat swab test can be done to confirm the diagnosis. °TREATMENT  °In mild cases, influenza goes away on its own. Treatment is directed at relieving symptoms. For more severe cases, your health care provider may prescribe antiviral medicines to shorten the sickness. Antibiotic medicines are not effective because the infection is caused by a virus, not by  bacteria. °HOME CARE INSTRUCTIONS °· Take medicines only as directed by your health care provider. °· Use a cool mist humidifier to make breathing easier. °· Get plenty of rest until your temperature returns to normal. This usually takes 3 to 4 days. °· Drink enough fluid to keep your urine clear or pale yellow. °· Cover your mouth and nose when coughing or sneezing, and wash your hands well to prevent the virus from spreading. °· Stay home from work or school until the fever is gone for at least 1 full day. °PREVENTION  °An annual influenza vaccination (flu shot) is the best way to avoid getting influenza. An annual flu shot is now routinely recommended for all adults in the [place] °SEEK MEDICAL CARE IF: °· You experience chest pain, your cough worsens, or you produce more mucus. °· You have nausea, vomiting, or diarrhea. °· Your fever returns or gets worse. °SEEK IMMEDIATE MEDICAL CARE IF: °· You have trouble breathing, you become short of breath, or your skin or nails become bluish. °· You have severe pain or stiffness in the neck. °· You develop a sudden headache, or pain in the face or ear. °· You have nausea or vomiting that you cannot control. °MAKE SURE YOU:  °· Understand these instructions. °· Will watch your condition. °· Will get help right away if you are not doing well or get worse. °  °This information is not intended to replace advice given to you by your health care provider. Make sure you discuss any questions you have with your health care provider. °  °Document Released: 01/24/2000 Document Revised: 02/16/2014 Document Reviewed: 04/27/2011 °Elsevier Interactive Patient Education ©2016 [employer] ° °

## 2015-02-24 ENCOUNTER — Inpatient Hospital Stay (HOSPITAL_COMMUNITY)
Admission: EM | Admit: 2015-02-24 | Discharge: 2015-03-04 | DRG: 441 | Disposition: A | Discharge status: Home / Self Care | Payer: Self-pay | Attending: Internal Medicine | Admitting: Internal Medicine

## 2015-02-24 ENCOUNTER — Encounter (HOSPITAL_COMMUNITY): Payer: Self-pay | Admitting: Emergency Medicine

## 2015-02-24 ENCOUNTER — Emergency Department (HOSPITAL_COMMUNITY): Payer: MEDICAID

## 2015-02-24 ENCOUNTER — Emergency Department (HOSPITAL_COMMUNITY): Payer: Self-pay

## 2015-02-24 DIAGNOSIS — A419 Sepsis, unspecified organism: Secondary | ICD-10-CM

## 2015-02-24 DIAGNOSIS — A5901 Trichomonal vulvovaginitis: Secondary | ICD-10-CM | POA: Diagnosis present

## 2015-02-24 DIAGNOSIS — E872 Acidosis, unspecified: Secondary | ICD-10-CM | POA: Diagnosis present

## 2015-02-24 DIAGNOSIS — J449 Chronic obstructive pulmonary disease, unspecified: Secondary | ICD-10-CM | POA: Diagnosis present

## 2015-02-24 DIAGNOSIS — K7011 Alcoholic hepatitis with ascites: Secondary | ICD-10-CM | POA: Diagnosis present

## 2015-02-24 DIAGNOSIS — M1990 Unspecified osteoarthritis, unspecified site: Secondary | ICD-10-CM | POA: Diagnosis present

## 2015-02-24 DIAGNOSIS — E871 Hypo-osmolality and hyponatremia: Secondary | ICD-10-CM | POA: Diagnosis present

## 2015-02-24 DIAGNOSIS — G9340 Encephalopathy, unspecified: Secondary | ICD-10-CM

## 2015-02-24 DIAGNOSIS — I110 Hypertensive heart disease with heart failure: Secondary | ICD-10-CM | POA: Diagnosis present

## 2015-02-24 DIAGNOSIS — D684 Acquired coagulation factor deficiency: Secondary | ICD-10-CM | POA: Diagnosis present

## 2015-02-24 DIAGNOSIS — E7220 Disorder of urea cycle metabolism, unspecified: Secondary | ICD-10-CM | POA: Diagnosis present

## 2015-02-24 DIAGNOSIS — R7401 Elevation of levels of liver transaminase levels: Secondary | ICD-10-CM

## 2015-02-24 DIAGNOSIS — F1410 Cocaine abuse, uncomplicated: Secondary | ICD-10-CM | POA: Diagnosis present

## 2015-02-24 DIAGNOSIS — Z86711 Personal history of pulmonary embolism: Secondary | ICD-10-CM

## 2015-02-24 DIAGNOSIS — Z7982 Long term (current) use of aspirin: Secondary | ICD-10-CM

## 2015-02-24 DIAGNOSIS — N179 Acute kidney failure, unspecified: Secondary | ICD-10-CM | POA: Diagnosis present

## 2015-02-24 DIAGNOSIS — F17210 Nicotine dependence, cigarettes, uncomplicated: Secondary | ICD-10-CM | POA: Diagnosis present

## 2015-02-24 DIAGNOSIS — I5030 Unspecified diastolic (congestive) heart failure: Secondary | ICD-10-CM | POA: Diagnosis present

## 2015-02-24 DIAGNOSIS — B9620 Unspecified Escherichia coli [E. coli] as the cause of diseases classified elsewhere: Secondary | ICD-10-CM | POA: Diagnosis present

## 2015-02-24 DIAGNOSIS — T361X6A Underdosing of cephalosporins and other beta-lactam antibiotics, initial encounter: Secondary | ICD-10-CM | POA: Diagnosis present

## 2015-02-24 DIAGNOSIS — G312 Degeneration of nervous system due to alcohol: Secondary | ICD-10-CM | POA: Diagnosis present

## 2015-02-24 DIAGNOSIS — K859 Acute pancreatitis without necrosis or infection, unspecified: Secondary | ICD-10-CM | POA: Diagnosis present

## 2015-02-24 DIAGNOSIS — B179 Acute viral hepatitis, unspecified: Secondary | ICD-10-CM

## 2015-02-24 DIAGNOSIS — I10 Essential (primary) hypertension: Secondary | ICD-10-CM | POA: Diagnosis present

## 2015-02-24 DIAGNOSIS — R740 Nonspecific elevation of levels of transaminase and lactic acid dehydrogenase [LDH]: Secondary | ICD-10-CM

## 2015-02-24 DIAGNOSIS — B1910 Unspecified viral hepatitis B without hepatic coma: Secondary | ICD-10-CM | POA: Diagnosis present

## 2015-02-24 DIAGNOSIS — J45909 Unspecified asthma, uncomplicated: Secondary | ICD-10-CM | POA: Diagnosis present

## 2015-02-24 DIAGNOSIS — E876 Hypokalemia: Secondary | ICD-10-CM | POA: Diagnosis present

## 2015-02-24 DIAGNOSIS — Z8249 Family history of ischemic heart disease and other diseases of the circulatory system: Secondary | ICD-10-CM

## 2015-02-24 DIAGNOSIS — K7200 Acute and subacute hepatic failure without coma: Secondary | ICD-10-CM

## 2015-02-24 DIAGNOSIS — B169 Acute hepatitis B without delta-agent and without hepatic coma: Secondary | ICD-10-CM | POA: Diagnosis present

## 2015-02-24 DIAGNOSIS — F1010 Alcohol abuse, uncomplicated: Secondary | ICD-10-CM | POA: Diagnosis present

## 2015-02-24 DIAGNOSIS — Z515 Encounter for palliative care: Secondary | ICD-10-CM | POA: Insufficient documentation

## 2015-02-24 DIAGNOSIS — N390 Urinary tract infection, site not specified: Secondary | ICD-10-CM | POA: Diagnosis present

## 2015-02-24 DIAGNOSIS — E162 Hypoglycemia, unspecified: Secondary | ICD-10-CM | POA: Diagnosis present

## 2015-02-24 LAB — COMPREHENSIVE METABOLIC PANEL
ALBUMIN: 2.8 g/dL — AB (ref 3.5–5.0)
ALK PHOS: 203 U/L — AB (ref 38–126)
ALT: 2203 U/L — ABNORMAL HIGH (ref 14–54)
ANION GAP: 21 — AB (ref 5–15)
AST: 5555 U/L — AB (ref 15–41)
BILIRUBIN TOTAL: 8.5 mg/dL — AB (ref 0.3–1.2)
BUN: 14 mg/dL (ref 6–20)
CALCIUM: 9.6 mg/dL (ref 8.9–10.3)
CO2: 20 mmol/L — AB (ref 22–32)
Chloride: 90 mmol/L — ABNORMAL LOW (ref 101–111)
Creatinine, Ser: 1.65 mg/dL — ABNORMAL HIGH (ref 0.44–1.00)
GFR calc Af Amer: 40 mL/min — ABNORMAL LOW (ref >60)
GFR calc non Af Amer: 34 mL/min — ABNORMAL LOW (ref >60)
GLUCOSE: 64 mg/dL — AB (ref 65–99)
POTASSIUM: 4 mmol/L (ref 3.5–5.1)
SODIUM: 131 mmol/L — AB (ref 135–145)
TOTAL PROTEIN: 6.4 g/dL — AB (ref 6.5–8.1)

## 2015-02-24 LAB — DIFFERENTIAL
BAND NEUTROPHILS: 15 %
BASOS ABS: 0.2 10*3/uL — AB (ref 0.0–0.1)
Basophils Relative: 1 %
Blasts: 0 %
EOS ABS: 0 10*3/uL (ref 0.0–0.7)
Eosinophils Relative: 0 %
LYMPHS ABS: 3.3 10*3/uL (ref 0.7–4.0)
Lymphocytes Relative: 15 %
METAMYELOCYTES PCT: 0 %
MONO ABS: 1.1 10*3/uL — AB (ref 0.1–1.0)
MONOS PCT: 13 %
MYELOCYTES: 0 %
NEUTROS ABS: 7 10*3/uL (ref 1.7–7.7)
NRBC: 0 /100{WBCs}
Neutrophils Relative %: 56 %
Other: 0 %
Promyelocytes Absolute: 0 %

## 2015-02-24 LAB — URINE MICROSCOPIC-ADD ON

## 2015-02-24 LAB — PHOSPHORUS: Phosphorus: 4.7 mg/dL — ABNORMAL HIGH (ref 2.5–4.6)

## 2015-02-24 LAB — RAPID URINE DRUG SCREEN, HOSP PERFORMED
AMPHETAMINES: NOT DETECTED
BENZODIAZEPINES: NOT DETECTED
Barbiturates: NOT DETECTED
Cocaine: POSITIVE — AB
OPIATES: NOT DETECTED
Tetrahydrocannabinol: NOT DETECTED

## 2015-02-24 LAB — LIPASE, BLOOD: LIPASE: 77 U/L — AB (ref 11–51)

## 2015-02-24 LAB — URINALYSIS, ROUTINE W REFLEX MICROSCOPIC
Glucose, UA: NEGATIVE mg/dL
KETONES UR: 15 mg/dL — AB
NITRITE: NEGATIVE
PH: 5 (ref 5.0–8.0)
PROTEIN: NEGATIVE mg/dL
Specific Gravity, Urine: 1.016 (ref 1.005–1.030)

## 2015-02-24 LAB — CBC
HEMATOCRIT: 38.1 % (ref 36.0–46.0)
HEMOGLOBIN: 13.6 g/dL (ref 12.0–15.0)
MCH: 32.9 pg (ref 26.0–34.0)
MCHC: 35.7 g/dL (ref 30.0–36.0)
MCV: 92 fL (ref 78.0–100.0)
Platelets: 185 10*3/uL (ref 150–400)
RBC: 4.14 MIL/uL (ref 3.87–5.11)
RDW: 14.1 % (ref 11.5–15.5)
WBC: 11.2 10*3/uL — AB (ref 4.0–10.5)

## 2015-02-24 LAB — I-STAT CG4 LACTIC ACID, ED
LACTIC ACID, VENOUS: 5.98 mmol/L — AB (ref 0.5–2.0)
Lactic Acid, Venous: 5.64 mmol/L (ref 0.5–2.0)

## 2015-02-24 LAB — ETHANOL

## 2015-02-24 LAB — CBG MONITORING, ED: GLUCOSE-CAPILLARY: 67 mg/dL (ref 65–99)

## 2015-02-24 LAB — I-STAT TROPONIN, ED: TROPONIN I, POC: 0.02 ng/mL (ref 0.00–0.08)

## 2015-02-24 LAB — PROTIME-INR
INR: 3.34 — AB (ref 0.00–1.49)
PROTHROMBIN TIME: 33.2 s — AB (ref 11.6–15.2)

## 2015-02-24 LAB — AMMONIA: Ammonia: 78 umol/L — ABNORMAL HIGH (ref 9–35)

## 2015-02-24 LAB — SALICYLATE LEVEL: Salicylate Lvl: 6.8 mg/dL (ref 2.8–30.0)

## 2015-02-24 LAB — APTT: APTT: 42 s — AB (ref 24–37)

## 2015-02-24 LAB — ACETAMINOPHEN LEVEL: Acetaminophen (Tylenol), Serum: <10 ug/mL — ABNORMAL LOW (ref 10–30)

## 2015-02-24 MED ORDER — VANCOMYCIN HCL IN DEXTROSE 1-5 GM/200ML-% IV SOLN
1000.0000 mg | Freq: Once | INTRAVENOUS | Status: AC
  Administered 2015-02-24: 1000 mg via INTRAVENOUS
  Filled 2015-02-24: qty 200

## 2015-02-24 MED ORDER — SODIUM CHLORIDE 0.9 % IV BOLUS (SEPSIS)
500.0000 mL | INTRAVENOUS | Status: AC
  Administered 2015-02-24: 500 mL via INTRAVENOUS

## 2015-02-24 MED ORDER — VANCOMYCIN HCL IN DEXTROSE 1-5 GM/200ML-% IV SOLN: 1000.0000 mg | INTRAVENOUS | Status: DC

## 2015-02-24 MED ORDER — LACTULOSE 10 GM/15ML PO SOLN
10.0000 g | Freq: Once | ORAL | Status: AC
  Administered 2015-02-24: 10 g via ORAL
  Filled 2015-02-24: qty 15

## 2015-02-24 MED ORDER — SODIUM CHLORIDE 0.9 % IV BOLUS (SEPSIS)
1000.0000 mL | INTRAVENOUS | Status: AC
  Administered 2015-02-24 (×2): 1000 mL via INTRAVENOUS

## 2015-02-24 MED ORDER — VANCOMYCIN HCL 500 MG IV SOLR
500.0000 mg | Freq: Once | INTRAVENOUS | Status: AC
  Administered 2015-02-24: 500 mg via INTRAVENOUS
  Filled 2015-02-24: qty 500

## 2015-02-24 MED ORDER — KCL IN DEXTROSE-NACL 20-5-0.45 MEQ/L-%-% IV SOLN
Freq: Once | INTRAVENOUS | Status: AC
  Administered 2015-02-24: 22:00:00 via INTRAVENOUS
  Filled 2015-02-24: qty 1000

## 2015-02-24 MED ORDER — DEXTROSE 50 % IV SOLN
1.0000 | Freq: Once | INTRAVENOUS | Status: AC
  Administered 2015-02-24: 50 mL via INTRAVENOUS
  Filled 2015-02-24: qty 50

## 2015-02-24 MED ORDER — PIPERACILLIN-TAZOBACTAM 3.375 G IVPB: 3.3750 g | Freq: Three times a day (TID) | INTRAVENOUS | Status: DC

## 2015-02-24 MED ORDER — PIPERACILLIN-TAZOBACTAM 3.375 G IVPB 30 MIN
3.3750 g | Freq: Once | INTRAVENOUS | Status: AC
  Administered 2015-02-24: 3.375 g via INTRAVENOUS
  Filled 2015-02-24: qty 50

## 2015-02-24 NOTE — ED Notes (Signed)
Poor historian.Marland KitchenMarland KitchenC/o chest pain, sob, bilateral leg pain, and unsteady gait x 1 week.  Sister reports that pt has "a lot going on" and needs medication adjusted.  Sister also reports pt is having visual and auditory hallucinations.  Denies SI.  Reports etoh use today.

## 2015-02-24 NOTE — ED Notes (Signed)
Admitting MD notified fluids are finished

## 2015-02-24 NOTE — ED Notes (Signed)
Placed pt on 2L [place]. O2 sats dropping down to 85% while pt was sleeping
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
## 2015-02-24 NOTE — ED Notes (Signed)
Phlebotomy has attempted x2 to get second set of cultures.
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
## 2015-02-24 NOTE — ED Notes (Signed)
Pt returned to room  
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
## 2015-02-24 NOTE — ED Notes (Signed)
[facility] [phone] [facility] [phone]

## 2015-02-25 ENCOUNTER — Inpatient Hospital Stay (HOSPITAL_COMMUNITY): Payer: Self-pay

## 2015-02-25 DIAGNOSIS — B179 Acute viral hepatitis, unspecified: Secondary | ICD-10-CM | POA: Diagnosis present

## 2015-02-25 DIAGNOSIS — K7200 Acute and subacute hepatic failure without coma: Principal | ICD-10-CM

## 2015-02-25 DIAGNOSIS — N179 Acute kidney failure, unspecified: Secondary | ICD-10-CM

## 2015-02-25 DIAGNOSIS — F1010 Alcohol abuse, uncomplicated: Secondary | ICD-10-CM

## 2015-02-25 DIAGNOSIS — B1910 Unspecified viral hepatitis B without hepatic coma: Secondary | ICD-10-CM | POA: Diagnosis present

## 2015-02-25 DIAGNOSIS — G9340 Encephalopathy, unspecified: Secondary | ICD-10-CM

## 2015-02-25 LAB — MRSA PCR SCREENING: MRSA by PCR: NEGATIVE

## 2015-02-25 LAB — HEPATITIS PANEL, ACUTE
HCV Ab: <0.1 s/co ratio (ref 0.0–0.9)
HEP B S AG: POSITIVE — AB
Hep A IgM: NEGATIVE
Hep B C IgM: POSITIVE — AB

## 2015-02-25 LAB — GLUCOSE, CAPILLARY
Glucose-Capillary: 139 mg/dL — ABNORMAL HIGH (ref 65–99)
Glucose-Capillary: 193 mg/dL — ABNORMAL HIGH (ref 65–99)

## 2015-02-25 LAB — CBC
HEMATOCRIT: 33.3 % — AB (ref 36.0–46.0)
Hemoglobin: 12.3 g/dL (ref 12.0–15.0)
MCH: 33.6 pg (ref 26.0–34.0)
MCHC: 36.9 g/dL — AB (ref 30.0–36.0)
MCV: 91 fL (ref 78.0–100.0)
PLATELETS: 159 10*3/uL (ref 150–400)
RBC: 3.66 MIL/uL — ABNORMAL LOW (ref 3.87–5.11)
RDW: 13.9 % (ref 11.5–15.5)
WBC: 8.8 10*3/uL (ref 4.0–10.5)

## 2015-02-25 LAB — COMPREHENSIVE METABOLIC PANEL
ALBUMIN: 2.4 g/dL — AB (ref 3.5–5.0)
ALK PHOS: 172 U/L — AB (ref 38–126)
ALT: 1822 U/L — AB (ref 14–54)
AST: 4069 U/L — ABNORMAL HIGH (ref 15–41)
Anion gap: 6 (ref 5–15)
BILIRUBIN TOTAL: 7.8 mg/dL — AB (ref 0.3–1.2)
BUN: 14 mg/dL (ref 6–20)
CALCIUM: 8.3 mg/dL — AB (ref 8.9–10.3)
CO2: 24 mmol/L (ref 22–32)
CREATININE: 1.2 mg/dL — AB (ref 0.44–1.00)
Chloride: 101 mmol/L (ref 101–111)
GFR calc Af Amer: 58 mL/min — ABNORMAL LOW (ref >60)
GFR calc non Af Amer: 50 mL/min — ABNORMAL LOW (ref >60)
GLUCOSE: 167 mg/dL — AB (ref 65–99)
Potassium: 3.5 mmol/L (ref 3.5–5.1)
SODIUM: 131 mmol/L — AB (ref 135–145)
TOTAL PROTEIN: 5.6 g/dL — AB (ref 6.5–8.1)

## 2015-02-25 LAB — RPR: RPR Ser Ql: NONREACTIVE

## 2015-02-25 LAB — TROPONIN I: TROPONIN I: 0.12 ng/mL — AB (ref <0.031)

## 2015-02-25 LAB — PROCALCITONIN: Procalcitonin: 3.53 ng/mL

## 2015-02-25 LAB — FIBRINOGEN: Fibrinogen: 149 mg/dL — ABNORMAL LOW (ref 204–475)

## 2015-02-25 LAB — LACTIC ACID, PLASMA: Lactic Acid, Venous: 2.2 mmol/L (ref 0.5–2.0)

## 2015-02-25 LAB — HIV ANTIBODY (ROUTINE TESTING W REFLEX): HIV SCREEN 4TH GENERATION: NONREACTIVE

## 2015-02-25 LAB — PROTIME-INR
INR: 3.39 — ABNORMAL HIGH (ref 0.00–1.49)
Prothrombin Time: 33.6 seconds — ABNORMAL HIGH (ref 11.6–15.2)

## 2015-02-25 LAB — MAGNESIUM
Magnesium: 1.8 mg/dL (ref 1.7–2.4)
Magnesium: 1.7 mg/dL (ref 1.7–2.4)

## 2015-02-25 LAB — PHOSPHORUS: Phosphorus: 3.1 mg/dL (ref 2.5–4.6)

## 2015-02-25 LAB — APTT: APTT: 41 s — AB (ref 24–37)

## 2015-02-25 MED ORDER — ONDANSETRON HCL 4 MG/2ML IJ SOLN: 4.0000 mg | Freq: Four times a day (QID) | INTRAMUSCULAR | Status: DC | PRN

## 2015-02-25 MED ORDER — ADULT MULTIVITAMIN W/MINERALS CH
1.0000 | ORAL_TABLET | Freq: Every day | ORAL | Status: DC
  Administered 2015-02-25 – 2015-03-04 (×8): 1 via ORAL
  Filled 2015-02-25 (×9): qty 1

## 2015-02-25 MED ORDER — DEXTROSE 5 % IV SOLN
2.0000 g | INTRAVENOUS | Status: DC
  Administered 2015-02-25 – 2015-03-03 (×7): 2 g via INTRAVENOUS
  Filled 2015-02-25 (×13): qty 2

## 2015-02-25 MED ORDER — ENOXAPARIN SODIUM 40 MG/0.4ML ~~LOC~~ SOLN
40.0000 mg | SUBCUTANEOUS | Status: DC
  Administered 2015-02-25 – 2015-02-27 (×3): 40 mg via SUBCUTANEOUS
  Filled 2015-02-25 (×3): qty 0.4

## 2015-02-25 MED ORDER — FOLIC ACID 1 MG PO TABS
1.0000 mg | ORAL_TABLET | Freq: Every day | ORAL | Status: DC
  Administered 2015-02-25 – 2015-03-04 (×8): 1 mg via ORAL
  Filled 2015-02-25 (×8): qty 1

## 2015-02-25 MED ORDER — LORAZEPAM 1 MG PO TABS: 1.0000 mg | ORAL_TABLET | Freq: Four times a day (QID) | ORAL | Status: AC | PRN

## 2015-02-25 MED ORDER — METRONIDAZOLE IVPB CUSTOM
2.0000 g | Freq: Once | INTRAVENOUS | Status: AC
  Administered 2015-02-25: 2 g via INTRAVENOUS
  Filled 2015-02-25: qty 400

## 2015-02-25 MED ORDER — LORAZEPAM 2 MG/ML IJ SOLN: 1.0000 mg | Freq: Four times a day (QID) | INTRAMUSCULAR | Status: AC | PRN

## 2015-02-25 MED ORDER — HYDRALAZINE HCL 20 MG/ML IJ SOLN
10.0000 mg | Freq: Four times a day (QID) | INTRAMUSCULAR | Status: DC | PRN
  Administered 2015-02-25: 10 mg via INTRAVENOUS
  Filled 2015-02-25: qty 1

## 2015-02-25 MED ORDER — TRAMADOL HCL 50 MG PO TABS
50.0000 mg | ORAL_TABLET | Freq: Once | ORAL | Status: AC
Start: 2015-02-25 — End: 2015-02-25
  Administered 2015-02-25: 50 mg via ORAL
  Filled 2015-02-25: qty 1

## 2015-02-25 MED ORDER — HYDRALAZINE HCL 10 MG PO TABS
10.0000 mg | ORAL_TABLET | Freq: Once | ORAL | Status: AC
  Administered 2015-02-25: 10 mg via ORAL
  Filled 2015-02-25: qty 1

## 2015-02-25 MED ORDER — DEXTROSE 5 % IV SOLN: 2.0000 g | Freq: Once | INTRAVENOUS | Status: DC

## 2015-02-25 MED ORDER — LACTULOSE 10 GM/15ML PO SOLN
20.0000 g | Freq: Three times a day (TID) | ORAL | Status: DC
  Administered 2015-02-25 – 2015-03-04 (×21): 20 g via ORAL
  Filled 2015-02-25 (×22): qty 30

## 2015-02-25 MED ORDER — PREDNISOLONE 5 MG PO TABS
40.0000 mg | ORAL_TABLET | Freq: Every day | ORAL | Status: DC
  Administered 2015-02-25 – 2015-02-28 (×4): 40 mg via ORAL
  Filled 2015-02-25 (×5): qty 8

## 2015-02-25 MED ORDER — IPRATROPIUM-ALBUTEROL 0.5-2.5 (3) MG/3ML IN SOLN: 3.0000 mL | Freq: Four times a day (QID) | RESPIRATORY_TRACT | Status: DC | PRN

## 2015-02-25 MED ORDER — THIAMINE HCL 100 MG/ML IJ SOLN
100.0000 mg | Freq: Every day | INTRAMUSCULAR | Status: DC
  Administered 2015-02-25: 100 mg via INTRAVENOUS
  Filled 2015-02-25: qty 1

## 2015-02-25 MED ORDER — METHYLPREDNISOLONE SODIUM SUCC 40 MG IJ SOLR
40.0000 mg | Freq: Every day | INTRAMUSCULAR | Status: DC
  Filled 2015-02-25: qty 1

## 2015-02-25 MED ORDER — VITAMIN K1 10 MG/ML IJ SOLN
5.0000 mg | Freq: Once | INTRAVENOUS | Status: AC
  Administered 2015-02-25: 5 mg via INTRAVENOUS
  Filled 2015-02-25: qty 0.5

## 2015-02-25 MED ORDER — PANTOPRAZOLE SODIUM 40 MG IV SOLR
40.0000 mg | Freq: Every day | INTRAVENOUS | Status: DC
  Administered 2015-02-25 – 2015-02-27 (×4): 40 mg via INTRAVENOUS
  Filled 2015-02-25 (×4): qty 40

## 2015-02-25 MED ORDER — VITAMIN B-1 100 MG PO TABS
100.0000 mg | ORAL_TABLET | Freq: Every day | ORAL | Status: DC
  Administered 2015-02-26 – 2015-03-04 (×7): 100 mg via ORAL
  Filled 2015-02-25 (×9): qty 1

## 2015-02-25 MED ORDER — INFLUENZA VAC SPLIT QUAD 0.5 ML IM SUSY
0.5000 mL | PREFILLED_SYRINGE | INTRAMUSCULAR | Status: DC
  Filled 2015-02-25: qty 0.5

## 2015-02-25 MED ORDER — DEXTROSE-NACL 5-0.9 % IV SOLN
INTRAVENOUS | Status: AC
  Administered 2015-02-25: 01:00:00 via INTRAVENOUS
  Administered 2015-02-25: 150 mL/h via INTRAVENOUS

## 2015-02-25 MED ORDER — PNEUMOCOCCAL VAC POLYVALENT 25 MCG/0.5ML IJ INJ
0.5000 mL | INJECTION | INTRAMUSCULAR | Status: DC
  Filled 2015-02-25: qty 0.5

## 2015-02-25 MED ORDER — SODIUM CHLORIDE 0.9 % IV SOLN: 250.0000 mL | INTRAVENOUS | Status: DC | PRN

## 2015-02-25 MED ORDER — HYDRALAZINE HCL 20 MG/ML IJ SOLN
5.0000 mg | INTRAMUSCULAR | Status: DC | PRN
  Administered 2015-02-25: 5 mg via INTRAVENOUS
  Filled 2015-02-25: qty 1

## 2015-02-25 MED ORDER — METHYLPREDNISOLONE SODIUM SUCC 40 MG IJ SOLR
40.0000 mg | Freq: Once | INTRAMUSCULAR | Status: AC
Start: 2015-02-25 — End: 2015-02-25
  Administered 2015-02-25: 40 mg via INTRAVENOUS
  Filled 2015-02-25: qty 1

## 2015-02-25 NOTE — Progress Notes (Signed)
ANTIBIOTIC CONSULT NOTE - INITIAL  Pharmacy Consult for Rocephin Indication: UTI  Labs:  Recent Labs  02/24/15 1613  WBC 11.2*  HGB 13.6  PLT 185  CREATININE 1.65*    Assessment/Plan:  54yo female started on IV ABX for possible sepsis, now to narrow for UTI.  Will start Rocephin 2g IV Q24H and monitor CBC and Cx.  Vernard Gambles, PharmD, BCPS  02/25/2015,12:24 AM

## 2015-02-25 NOTE — Progress Notes (Signed)
CRITICAL VALUE ALERT  Critical value received:  Lactic Acid= 2.2  Date of notification:  02/25/15  Time of notification:  0434  Critical value read back:Yes.    Nurse who received alert:  Dustin Flock RN  MD notified (1st page):  Dr Allena Katz  Time of first page:  [phone]  MD notified (2nd page):  Time of second page:  Responding MD:  Dr Allena Katz  Time MD responded:  [phone]

## 2015-02-25 NOTE — Progress Notes (Signed)
Pt's daughter Lysle Rubens [phone]
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
## 2015-02-25 NOTE — Progress Notes (Signed)
Utilization review completed. Chariah Bailey, RN, BSN. 

## 2015-02-26 ENCOUNTER — Inpatient Hospital Stay (HOSPITAL_COMMUNITY): Payer: MEDICAID

## 2015-02-26 ENCOUNTER — Inpatient Hospital Stay (HOSPITAL_COMMUNITY): Payer: Self-pay

## 2015-02-26 DIAGNOSIS — E7220 Disorder of urea cycle metabolism, unspecified: Secondary | ICD-10-CM

## 2015-02-26 DIAGNOSIS — A5901 Trichomonal vulvovaginitis: Secondary | ICD-10-CM

## 2015-02-26 DIAGNOSIS — I10 Essential (primary) hypertension: Secondary | ICD-10-CM

## 2015-02-26 DIAGNOSIS — R7401 Elevation of levels of liver transaminase levels: Secondary | ICD-10-CM | POA: Diagnosis present

## 2015-02-26 DIAGNOSIS — R740 Nonspecific elevation of levels of transaminase and lactic acid dehydrogenase [LDH]: Secondary | ICD-10-CM

## 2015-02-26 DIAGNOSIS — F1410 Cocaine abuse, uncomplicated: Secondary | ICD-10-CM

## 2015-02-26 DIAGNOSIS — N179 Acute kidney failure, unspecified: Secondary | ICD-10-CM | POA: Diagnosis present

## 2015-02-26 DIAGNOSIS — B160 Acute hepatitis B with delta-agent with hepatic coma: Secondary | ICD-10-CM

## 2015-02-26 LAB — COMPREHENSIVE METABOLIC PANEL
ALT: 1660 U/L — AB (ref 14–54)
AST: 2530 U/L — AB (ref 15–41)
Albumin: 2.3 g/dL — ABNORMAL LOW (ref 3.5–5.0)
Alkaline Phosphatase: 171 U/L — ABNORMAL HIGH (ref 38–126)
Anion gap: 9 (ref 5–15)
BILIRUBIN TOTAL: 8.6 mg/dL — AB (ref 0.3–1.2)
BUN: 14 mg/dL (ref 6–20)
CHLORIDE: 104 mmol/L (ref 101–111)
CO2: 23 mmol/L (ref 22–32)
CREATININE: 0.7 mg/dL (ref 0.44–1.00)
Calcium: 9 mg/dL (ref 8.9–10.3)
Glucose, Bld: 113 mg/dL — ABNORMAL HIGH (ref 65–99)
POTASSIUM: 3.6 mmol/L (ref 3.5–5.1)
Sodium: 136 mmol/L (ref 135–145)
TOTAL PROTEIN: 6.1 g/dL — AB (ref 6.5–8.1)

## 2015-02-26 LAB — CBC
HEMATOCRIT: 17 % — AB (ref 36.0–46.0)
HEMOGLOBIN: 13.7 g/dL (ref 12.0–15.0)
MCH: 33.3 pg (ref 26.0–34.0)
MCHC: 37 g/dL — AB (ref 30.0–36.0)
MCV: 88.8 fL (ref 78.0–100.0)
Platelets: 195 10*3/uL (ref 150–400)
RBC: 4.12 MIL/uL (ref 3.87–5.11)
RDW: 14 % (ref 11.5–15.5)
WBC: 8 10*3/uL (ref 4.0–10.5)

## 2015-02-26 LAB — MAGNESIUM: Magnesium: 1.8 mg/dL (ref 1.7–2.4)

## 2015-02-26 LAB — LACTIC ACID, PLASMA
LACTIC ACID, VENOUS: 1.4 mmol/L (ref 0.5–2.0)
Lactic Acid, Venous: 1.5 mmol/L (ref 0.5–2.0)

## 2015-02-26 LAB — PHOSPHORUS: Phosphorus: 2.6 mg/dL (ref 2.5–4.6)

## 2015-02-26 LAB — PROCALCITONIN: Procalcitonin: 1.57 ng/mL

## 2015-02-26 LAB — LIPASE, BLOOD: LIPASE: 118 U/L — AB (ref 11–51)

## 2015-02-26 LAB — PROTIME-INR
INR: 2.99 — AB (ref 0.00–1.49)
PROTHROMBIN TIME: 30.5 s — AB (ref 11.6–15.2)

## 2015-02-26 LAB — APTT: aPTT: 46 seconds — ABNORMAL HIGH (ref 24–37)

## 2015-02-26 MED ORDER — GADOBENATE DIMEGLUMINE 529 MG/ML IV SOLN
20.0000 mL | Freq: Once | INTRAVENOUS | Status: AC
  Administered 2015-02-26: 17 mL via INTRAVENOUS

## 2015-02-26 MED ORDER — METOPROLOL TARTRATE 1 MG/ML IV SOLN: 2.5000 mg | Freq: Four times a day (QID) | INTRAVENOUS | Status: DC

## 2015-02-26 MED ORDER — CHLORHEXIDINE GLUCONATE 0.12 % MT SOLN
15.0000 mL | Freq: Two times a day (BID) | OROMUCOSAL | Status: DC
  Administered 2015-02-26 – 2015-03-04 (×13): 15 mL via OROMUCOSAL
  Filled 2015-02-26 (×12): qty 15

## 2015-02-26 MED ORDER — CETYLPYRIDINIUM CHLORIDE 0.05 % MT LIQD
7.0000 mL | Freq: Two times a day (BID) | OROMUCOSAL | Status: DC
  Administered 2015-02-26 – 2015-03-03 (×9): 7 mL via OROMUCOSAL

## 2015-02-26 MED ORDER — HYDRALAZINE HCL 20 MG/ML IJ SOLN
5.0000 mg | Freq: Four times a day (QID) | INTRAMUSCULAR | Status: DC
  Administered 2015-02-26 – 2015-03-01 (×11): 5 mg via INTRAVENOUS
  Filled 2015-02-26 (×11): qty 1
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
## 2015-02-26 NOTE — Care Management Note (Addendum)
Case Management Note  Patient Details  Name: Alexandria Jacobson MRN: [id number] Date of Birth: [birth year]  Subjective/Objective:      Date: 02/26/15 Spoke with patient at the bedside.  Introduced self as Sports coach and explained role in discharge planning and how to be reached.  Verified patient lives in town, with spouse.  Expressed potential need for no other DME.  Verified patient anticipates to go home with family,  at time of discharge and will have  part-time supervision by family at this time to best of their knowledge.  Patient confirmed  needing help with their medication.  Patient is driven by  Her mom to MD appointments.  Verified patient has no PCP, she has [facility] follow [facility] at 8:45, ( [facility] taking [facility]. NCM gave patient brochures.  Plan: CM will continue to follow for discharge planning and [facility] resources.               Action/Plan: Aggressive IV hydration for her pancreatitis. Avoid correction of INR unless there is evidence of spontaneous bleeding. Follow INR and mental status. NPO for now    Expected Discharge Date:                  Expected Discharge Plan:  Home/Self Care  In-House Referral:     Discharge planning Services  CM Consult, [facility], Follow-up appt scheduled, Medication Assistance  Post Acute Care Choice:    Choice offered to:     DME Arranged:    DME Agency:     HH Arranged:    HH Agency:     Status of Service:  In process, will continue to follow  Medicare Important Message Given:    Date Medicare IM Given:    Medicare IM give by:    Date Additional Medicare IM Given:    Additional Medicare Important Message give by:     If discussed at Long Length of Stay Meetings, dates discussed:    Additional Comments:    Leone Haven, RN 02/26/2015, 3:16 PM
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
## 2015-02-27 ENCOUNTER — Inpatient Hospital Stay (HOSPITAL_COMMUNITY): Payer: Self-pay

## 2015-02-27 DIAGNOSIS — I428 Other cardiomyopathies: Secondary | ICD-10-CM

## 2015-02-27 DIAGNOSIS — Z515 Encounter for palliative care: Secondary | ICD-10-CM

## 2015-02-27 LAB — TROPONIN I
TROPONIN I: 0.03 ng/mL (ref <0.031)
Troponin I: <0.03 ng/mL (ref <0.031)
Troponin I: 0.03 ng/mL (ref <0.031)

## 2015-02-27 LAB — URINE CULTURE: Culture: >=100000

## 2015-02-27 LAB — HEPATITIS B SURFACE ANTIBODY,QUALITATIVE: HEP B S AB: NONREACTIVE

## 2015-02-27 LAB — HEPATITIS B CORE ANTIBODY, TOTAL: HEP B C TOTAL AB: POSITIVE — AB

## 2015-02-27 MED ORDER — PREDNISONE 20 MG PO TABS: 40.0000 mg | ORAL_TABLET | Freq: Every day | ORAL | Status: DC

## 2015-02-27 MED ORDER — GERHARDT'S BUTT CREAM
TOPICAL_CREAM | Freq: Three times a day (TID) | CUTANEOUS | Status: DC | PRN
  Administered 2015-02-27 – 2015-03-02 (×4): via TOPICAL
  Filled 2015-02-27: qty 1

## 2015-02-27 NOTE — Progress Notes (Signed)
   02/27/15 1500  Clinical Encounter Type  Visited With Patient  Visit Type Spiritual support  Referral From Nurse  Spiritual Encounters  Spiritual Needs Prayer  Stress Factors  Patient Stress Factors Family relationships  Patient told chaplain that she needed sleep but also that she needed prayer, which chaplain provided, and that she would like chaplain to stop by again.
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
## 2015-02-27 NOTE — Progress Notes (Signed)
  Echocardiogram 2D Echocardiogram has been performed.  Alexandria Jacobson 02/27/2015, 1:47 PM

## 2015-02-28 DIAGNOSIS — E876 Hypokalemia: Secondary | ICD-10-CM

## 2015-02-28 LAB — COMPREHENSIVE METABOLIC PANEL
ALBUMIN: 2.2 g/dL — AB (ref 3.5–5.0)
ALT: 1072 U/L — ABNORMAL HIGH (ref 14–54)
ANION GAP: 6 (ref 5–15)
AST: 906 U/L — AB (ref 15–41)
Alkaline Phosphatase: 154 U/L — ABNORMAL HIGH (ref 38–126)
BUN: 15 mg/dL (ref 6–20)
CHLORIDE: 103 mmol/L (ref 101–111)
CO2: 22 mmol/L (ref 22–32)
Calcium: 9.1 mg/dL (ref 8.9–10.3)
Creatinine, Ser: 0.77 mg/dL (ref 0.44–1.00)
GFR calc Af Amer: >60 mL/min (ref >60)
GFR calc non Af Amer: >60 mL/min (ref >60)
GLUCOSE: 145 mg/dL — AB (ref 65–99)
POTASSIUM: 3.1 mmol/L — AB (ref 3.5–5.1)
Sodium: 131 mmol/L — ABNORMAL LOW (ref 135–145)
Total Bilirubin: 8.9 mg/dL — ABNORMAL HIGH (ref 0.3–1.2)
Total Protein: 5.6 g/dL — ABNORMAL LOW (ref 6.5–8.1)

## 2015-02-28 LAB — CBC
HEMATOCRIT: 33.3 % — AB (ref 36.0–46.0)
HEMOGLOBIN: 12.3 g/dL (ref 12.0–15.0)
MCH: 32.8 pg (ref 26.0–34.0)
MCHC: 36.9 g/dL — AB (ref 30.0–36.0)
MCV: 88.8 fL (ref 78.0–100.0)
Platelets: 140 10*3/uL — ABNORMAL LOW (ref 150–400)
RBC: 3.75 MIL/uL — ABNORMAL LOW (ref 3.87–5.11)
RDW: 14.5 % (ref 11.5–15.5)
WBC: 6 10*3/uL (ref 4.0–10.5)

## 2015-02-28 LAB — AMMONIA: AMMONIA: 56 umol/L — AB (ref 9–35)

## 2015-02-28 LAB — PROTIME-INR
INR: 2.12 — AB (ref 0.00–1.49)
Prothrombin Time: 23.6 seconds — ABNORMAL HIGH (ref 11.6–15.2)

## 2015-02-28 LAB — LIPASE, BLOOD: Lipase: 210 U/L — ABNORMAL HIGH (ref 11–51)

## 2015-02-28 MED ORDER — POTASSIUM CHLORIDE 10 MEQ/100ML IV SOLN
10.0000 meq | Freq: Once | INTRAVENOUS | Status: AC
  Administered 2015-02-28: 10 meq via INTRAVENOUS
  Filled 2015-02-28: qty 100

## 2015-02-28 MED ORDER — POTASSIUM CHLORIDE 10 MEQ/100ML IV SOLN
10.0000 meq | INTRAVENOUS | Status: AC
  Administered 2015-02-28 (×3): 10 meq via INTRAVENOUS
  Filled 2015-02-28 (×3): qty 100

## 2015-02-28 MED ORDER — PANTOPRAZOLE SODIUM 40 MG PO TBEC
40.0000 mg | DELAYED_RELEASE_TABLET | Freq: Every day | ORAL | Status: DC
  Administered 2015-02-28 – 2015-03-03 (×4): 40 mg via ORAL
  Filled 2015-02-28 (×4): qty 1
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
## 2015-02-28 NOTE — Progress Notes (Signed)
Subjective: Feeling better.  Objective: Vital signs in last 24 hours: Temp:  [97.6 F (36.4 C)-98.4 F (36.9 C)] 98.4 F (36.9 C) (01/19 0417) Pulse Rate:  [96-122] 102 (01/19 0417) Resp:  [19-25] 19 (01/19 0417) BP: (131-184)/(62-99) 184/99 mmHg (01/19 0417) SpO2:  [98 %-100 %] 99 % (01/19 0417) Weight:  [83 kg (182 lb 15.7 oz)-84.2 kg (185 lb 10 oz)] 84.2 kg (185 lb 10 oz) (01/19 0600) Last BM Date: 02/27/15  Intake/Output from previous day: 01/18 0701 - 01/19 0700 In: 240 [P.O.:240] Out: 350 [Urine:350] Intake/Output this shift:    General appearance: alert and no distress GI: soft, non-tender; bowel sounds normal; no masses,  no organomegaly  Lab Results:  Recent Labs  02/26/15 0357 02/28/15 0513  WBC 8.0 PENDING  HGB 13.7 12.3  HCT 17.0* 33.3*  PLT 195 PENDING   BMET  Recent Labs  02/26/15 0357 02/28/15 0513  NA 136 131*  K 3.6 3.1*  CL 104 103  CO2 23 22  GLUCOSE 113* 145*  BUN 14 15  CREATININE 0.70 0.77  CALCIUM 9.0 9.1   LFT  Recent Labs  02/28/15 0513  PROT 5.6*  ALBUMIN 2.2*  AST 906*  ALT 1072*  ALKPHOS 154*  BILITOT 8.9*   PT/INR  Recent Labs  02/26/15 0357  LABPROT 30.5*  INR 2.99*   Hepatitis Panel No results for input(s): HEPBSAG, HCVAB, HEPAIGM, HEPBIGM in the last 72 hours. C-Diff No results for input(s): CDIFFTOX in the last 72 hours. Fecal Lactopherrin No results for input(s): FECLLACTOFRN in the last 72 hours.  Studies/Results: Mr 3d Recon At Scanner  02/26/2015  CLINICAL DATA:  Acute pancreatitis. Layering material in the gallbladder. Abnormal liver function tests. Acute liver failure/alcoholic hepatitis/hepatitis-B. Encephalopathy. EXAM: MRI ABDOMEN WITHOUT AND WITH CONTRAST (INCLUDING MRCP) TECHNIQUE: Multiplanar multisequence MR imaging of the abdomen was performed both before and after the administration of intravenous contrast. Heavily T2-weighted images of the biliary and pancreatic ducts were obtained, and  three-dimensional MRCP images were rendered by post processing. CONTRAST:  1 MULTIHANCE GADOBENATE DIMEGLUMINE 529 MG/ML IV SOLN COMPARISON:  02/23/2014 and 02/24/2014 FINDINGS: Despite efforts by the technologist and patient, motion artifact is present on today's exam and could not be eliminated. This reduces exam sensitivity and specificity. Lower chest: Small bilateral pleural effusions with passive atelectasis, increased compared to the CT scan of 02/24/2015. Hepatobiliary: Periportal edema. Numerous tiny gallstones layering in the gallbladder. No significant abnormal enhancement of the gallbladder wall. No CBD dilatation, but the CBD is indistinct on the dedicated MRCP images due to motion artifact. No obvious beading or abnormal enhancement along the biliary tree. I do not see a definite filling defect in the CBD but sensitivity is adversely affected by the motion artifact. Early arterial phase heterogeneity of enhancement in the liver may reflect the patient's suspected underlying alcoholic hepatitis. This mild reticular enhancement is less conspicuous on the delayed images. No definite abnormal mass in the liver identified. No specific lesion to correspond with the lesion in the caudate lobe is observed, and I suspect the hypo echogenicity on ultrasound was spurious. Morphologic findings the liver raise suspicion for early cirrhosis. Pancreas: Pancreatic and peripancreatic edema. No pancreatic necrosis, abscess, or pseudocyst identified. Spleen: Unremarkable Adrenals/Urinary Tract: Unremarkable Stomach/Bowel: Unremarkable Vascular/Lymphatic: Unremarkable Other: Upper abdominal ascites most notable in the perisplenic and perihepatic region and tracking along the paracolic gutters. There is also edema tracking in the perirenal spaces bilaterally. Low-level subcutaneous edema noted. Musculoskeletal: Unremarkable IMPRESSION: 1. Acute pancreatitis. No  evidence of pancreatic abscess, pseudocyst, or pancreatic  necrosis. 2. Diffuse ascites.  Retroperitoneal edema.  Periportal edema. 3. Suspected tiny gallstones in the gallbladder. No biliary dilatation. No definite filling defect in the CBD. 4. Heterogeneous early enhancement of the liver probably from underlying alcoholic hepatitis. No hepatic mass identified. Morphologic findings in the liver suspicious for early cirrhosis. 5. Small bilateral pleural effusions with passive atelectasis. 6. Despite efforts by the technologist and patient, motion artifact is present on today's exam and could not be eliminated. This reduces exam sensitivity and specificity. Electronically Signed   By: Gaylyn Rong M.D.   On: 02/26/2015 10:52   Mr Abd W/wo Cm/mrcp  02/26/2015  CLINICAL DATA:  Acute pancreatitis. Layering material in the gallbladder. Abnormal liver function tests. Acute liver failure/alcoholic hepatitis/hepatitis-B. Encephalopathy. EXAM: MRI ABDOMEN WITHOUT AND WITH CONTRAST (INCLUDING MRCP) TECHNIQUE: Multiplanar multisequence MR imaging of the abdomen was performed both before and after the administration of intravenous contrast. Heavily T2-weighted images of the biliary and pancreatic ducts were obtained, and three-dimensional MRCP images were rendered by post processing. CONTRAST:  1 MULTIHANCE GADOBENATE DIMEGLUMINE 529 MG/ML IV SOLN COMPARISON:  02/23/2014 and 02/24/2014 FINDINGS: Despite efforts by the technologist and patient, motion artifact is present on today's exam and could not be eliminated. This reduces exam sensitivity and specificity. Lower chest: Small bilateral pleural effusions with passive atelectasis, increased compared to the CT scan of 02/24/2015. Hepatobiliary: Periportal edema. Numerous tiny gallstones layering in the gallbladder. No significant abnormal enhancement of the gallbladder wall. No CBD dilatation, but the CBD is indistinct on the dedicated MRCP images due to motion artifact. No obvious beading or abnormal enhancement along the  biliary tree. I do not see a definite filling defect in the CBD but sensitivity is adversely affected by the motion artifact. Early arterial phase heterogeneity of enhancement in the liver may reflect the patient's suspected underlying alcoholic hepatitis. This mild reticular enhancement is less conspicuous on the delayed images. No definite abnormal mass in the liver identified. No specific lesion to correspond with the lesion in the caudate lobe is observed, and I suspect the hypo echogenicity on ultrasound was spurious. Morphologic findings the liver raise suspicion for early cirrhosis. Pancreas: Pancreatic and peripancreatic edema. No pancreatic necrosis, abscess, or pseudocyst identified. Spleen: Unremarkable Adrenals/Urinary Tract: Unremarkable Stomach/Bowel: Unremarkable Vascular/Lymphatic: Unremarkable Other: Upper abdominal ascites most notable in the perisplenic and perihepatic region and tracking along the paracolic gutters. There is also edema tracking in the perirenal spaces bilaterally. Low-level subcutaneous edema noted. Musculoskeletal: Unremarkable IMPRESSION: 1. Acute pancreatitis. No evidence of pancreatic abscess, pseudocyst, or pancreatic necrosis. 2. Diffuse ascites.  Retroperitoneal edema.  Periportal edema. 3. Suspected tiny gallstones in the gallbladder. No biliary dilatation. No definite filling defect in the CBD. 4. Heterogeneous early enhancement of the liver probably from underlying alcoholic hepatitis. No hepatic mass identified. Morphologic findings in the liver suspicious for early cirrhosis. 5. Small bilateral pleural effusions with passive atelectasis. 6. Despite efforts by the technologist and patient, motion artifact is present on today's exam and could not be eliminated. This reduces exam sensitivity and specificity. Electronically Signed   By: Gaylyn Rong M.D.   On: 02/26/2015 10:52    Medications:  Scheduled: . antiseptic oral rinse  7 mL Mouth Rinse q12n4p  .  cefTRIAXone (ROCEPHIN)  IV  2 g Intravenous Q24H  . chlorhexidine  15 mL Mouth Rinse BID  . folic acid  1 mg Oral Daily  . hydrALAZINE  5 mg Intravenous Q6H  .  Influenza vac split quadrivalent PF  0.5 mL Intramuscular Tomorrow-1000  . lactulose  20 g Oral TID  . multivitamin with minerals  1 tablet Oral Daily  . pantoprazole (PROTONIX) IV  40 mg Intravenous QHS  . pneumococcal 23 valent vaccine  0.5 mL Intramuscular Tomorrow-1000  . prednisoLONE  40 mg Oral Daily  . thiamine  100 mg Oral Daily   Continuous:   Assessment/Plan: 1) Acute hepatitis B infection. (HBsAg+/HBsAb-/HBcIgM+). 2) ETOH pancreatitis. 3) ETOH abuse. 4) Substance abuse.   The patient appears to be much improved clinically.  Her liver enzymes continue to decline, but no repeat INR since 1/17.  Her TB is still elevated, but this value tends to lag.  There is no asterixis with examination today.  She should seroconvert with her HBsAb in the next 6 months and this should be checked by her PCP.  Also, her eAg/eAb status needs to be checked in case she is the rare case that she becomes a chronic HBV infection.  Plan: 1) Continue with supportive care. 2) Check INR to ensure that it is also improving.  She denies taking coumadin in a long time despite her PE history. 3) Check eAb/eAg status. 4) Signing off.  LOS: 4 days   Alanmichael Barmore D 02/28/2015, 7:28 AM

## 2015-02-28 NOTE — Progress Notes (Signed)
I spoke again with Ms. Gaynell Face. She tells me that she feels much better today. She continues to express interest in sobriety and rehabilitation. She is hopeful for recovery. Emotional support provided. She is appreciative of the care she has received here.   Yong Channel, NP Palliative Medicine Team Pager # [phone] (M-F 8a-5p) Team Phone # [phone] (Nights/Weekends)

## 2015-03-01 LAB — CULTURE, BLOOD (ROUTINE X 2)
CULTURE: NO GROWTH
Culture: NO GROWTH

## 2015-03-01 MED ORDER — TRAMADOL HCL 50 MG PO TABS
50.0000 mg | ORAL_TABLET | Freq: Two times a day (BID) | ORAL | Status: DC | PRN
  Administered 2015-03-01: 50 mg via ORAL
  Filled 2015-03-01: qty 1

## 2015-03-01 MED ORDER — POTASSIUM CHLORIDE CRYS ER 20 MEQ PO TBCR
40.0000 meq | EXTENDED_RELEASE_TABLET | Freq: Once | ORAL | Status: AC
  Administered 2015-03-01: 40 meq via ORAL
  Filled 2015-03-01: qty 2

## 2015-03-01 MED ORDER — PROPRANOLOL HCL 10 MG PO TABS
10.0000 mg | ORAL_TABLET | Freq: Three times a day (TID) | ORAL | Status: DC
  Administered 2015-03-01 – 2015-03-02 (×3): 10 mg via ORAL
  Filled 2015-03-01 (×6): qty 1

## 2015-03-01 MED ORDER — LEVALBUTEROL HCL 0.63 MG/3ML IN NEBU
0.6300 mg | INHALATION_SOLUTION | RESPIRATORY_TRACT | Status: DC | PRN
  Administered 2015-03-01: 0.63 mg via RESPIRATORY_TRACT
  Filled 2015-03-01: qty 3

## 2015-03-01 MED ORDER — PREDNISONE 20 MG PO TABS
40.0000 mg | ORAL_TABLET | Freq: Every day | ORAL | Status: DC
  Administered 2015-03-01 – 2015-03-04 (×4): 40 mg via ORAL
  Filled 2015-03-01 (×3): qty 2

## 2015-03-01 MED ORDER — CLONIDINE HCL 0.1 MG PO TABS
0.1000 mg | ORAL_TABLET | Freq: Three times a day (TID) | ORAL | Status: DC
  Administered 2015-03-01 – 2015-03-02 (×3): 0.1 mg via ORAL
  Filled 2015-03-01 (×3): qty 1

## 2015-03-01 MED ORDER — IBUPROFEN 400 MG PO TABS: 400.0000 mg | ORAL_TABLET | ORAL | Status: DC | PRN

## 2015-03-01 MED ORDER — HYDRALAZINE HCL 25 MG PO TABS: 25.0000 mg | ORAL_TABLET | Freq: Four times a day (QID) | ORAL | Status: DC
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
## 2015-03-02 LAB — CBC
HEMATOCRIT: 35.5 % — AB (ref 36.0–46.0)
HEMOGLOBIN: 13.5 g/dL (ref 12.0–15.0)
MCH: 32.9 pg (ref 26.0–34.0)
MCHC: 38 g/dL — AB (ref 30.0–36.0)
MCV: 86.6 fL (ref 78.0–100.0)
Platelets: 150 10*3/uL (ref 150–400)
RBC: 4.1 MIL/uL (ref 3.87–5.11)
RDW: 14.6 % (ref 11.5–15.5)
WBC: 8.6 10*3/uL (ref 4.0–10.5)

## 2015-03-02 LAB — COMPREHENSIVE METABOLIC PANEL
ALBUMIN: 1.8 g/dL — AB (ref 3.5–5.0)
ALT: 616 U/L — ABNORMAL HIGH (ref 14–54)
ANION GAP: 5 (ref 5–15)
AST: 401 U/L — ABNORMAL HIGH (ref 15–41)
Alkaline Phosphatase: 151 U/L — ABNORMAL HIGH (ref 38–126)
BUN: 9 mg/dL (ref 6–20)
CO2: 23 mmol/L (ref 22–32)
Calcium: 8.4 mg/dL — ABNORMAL LOW (ref 8.9–10.3)
Chloride: 102 mmol/L (ref 101–111)
Creatinine, Ser: 0.58 mg/dL (ref 0.44–1.00)
GFR calc Af Amer: >60 mL/min (ref >60)
GFR calc non Af Amer: >60 mL/min (ref >60)
GLUCOSE: 118 mg/dL — AB (ref 65–99)
POTASSIUM: 4 mmol/L (ref 3.5–5.1)
SODIUM: 130 mmol/L — AB (ref 135–145)
Total Bilirubin: 7.6 mg/dL — ABNORMAL HIGH (ref 0.3–1.2)
Total Protein: 5.4 g/dL — ABNORMAL LOW (ref 6.5–8.1)

## 2015-03-02 LAB — PROTIME-INR
INR: 1.58 — AB (ref 0.00–1.49)
Prothrombin Time: 18.9 seconds — ABNORMAL HIGH (ref 11.6–15.2)

## 2015-03-02 LAB — AMMONIA: Ammonia: 65 umol/L — ABNORMAL HIGH (ref 9–35)

## 2015-03-02 MED ORDER — CLONIDINE HCL 0.2 MG PO TABS
0.2000 mg | ORAL_TABLET | Freq: Three times a day (TID) | ORAL | Status: DC
  Administered 2015-03-02 – 2015-03-04 (×6): 0.2 mg via ORAL
  Filled 2015-03-02 (×6): qty 1

## 2015-03-02 MED ORDER — PROPRANOLOL HCL 10 MG PO TABS
10.0000 mg | ORAL_TABLET | Freq: Three times a day (TID) | ORAL | Status: DC
  Administered 2015-03-02 – 2015-03-04 (×6): 10 mg via ORAL
  Filled 2015-03-02 (×8): qty 1

## 2015-03-02 MED ORDER — ZOLPIDEM TARTRATE 5 MG PO TABS
5.0000 mg | ORAL_TABLET | Freq: Every evening | ORAL | Status: DC | PRN
  Administered 2015-03-02: 5 mg via ORAL
  Filled 2015-03-02: qty 1

## 2015-03-02 NOTE — Progress Notes (Signed)
UR COMPLETED  

## 2015-03-02 NOTE — Evaluation (Signed)
Physical Therapy Evaluation Patient Details Name: Alexandria Jacobson MRN: [id number] DOB: [birth year] Today's Date: 03/02/2015   History of Present Illness  Pt is a 55 year old female with PMH of HTN, Bronchitis, PE (previously on coumadin), and polysubstance abuse (alcohol, tobacco, cocaine). She presented to the ED with altered mental status and elevated liver function enzymes. Further evaluation revealed acute liver failure probably from acute hepatitis B, alcoholic hepatitis. Pt admitted for acute encephalopathy and acute liver failure.  Clinical Impression  Pt admitted with above diagnosis. Pt currently with functional limitations due to the deficits listed below (see PT Problem List). On eval, pt required supervision for bed mobility, min guard assist for transfers, and min guard assist ambulation with RW 230 feet. She initially required encouragement to participate in PT eval.  The mobility and getting out of her room seemed to lift her spirits. She was very grateful for PT assistance upon return to room. Pt will benefit from skilled PT to increase their independence and safety with mobility to allow discharge to the venue listed below.       Follow Up Recommendations Home health PT;Supervision/Assistance - 24 hour    Equipment Recommendations  Rolling walker with 5" wheels    Recommendations for Other Services       Precautions / Restrictions Precautions Precautions: Fall      Mobility  Bed Mobility Overal bed mobility: Needs Assistance Bed Mobility: Supine to Sit;Sit to Supine     Supine to sit: Supervision;HOB elevated Sit to supine: Supervision;HOB elevated   General bed mobility comments: supervision for safety only, use of bed rails  Transfers Overall transfer level: Needs assistance Equipment used: Rolling walker (2 wheeled) Transfers: Sit to/from [employer] Sit to Stand: Min guard Stand pivot transfers: Min guard       General transfer  comment: verbal cues for safety  Ambulation/Gait Ambulation/Gait assistance: Min guard Ambulation Distance (Feet): 230 Feet Assistive device: Rolling walker (2 wheeled) Gait Pattern/deviations: Step-through pattern;Decreased stride length Gait velocity: decreased   General Gait Details: Pt required seated rest break after 180 feet.  Stairs            Wheelchair Mobility    Modified Rankin (Stroke Patients Only)       Balance Overall balance assessment: Needs assistance Sitting-balance support: No upper extremity supported;Feet supported Sitting balance-Leahy Scale: Good     Standing balance support: Bilateral upper extremity supported;During functional activity Standing balance-Leahy Scale: Fair Standing balance comment: RW for ambulation                             Pertinent Vitals/Pain Pain Assessment: Faces Faces Pain Scale: Hurts little more Pain Location: "I'm kinda sore all over." Pain Descriptors / Indicators: Sore;Grimacing;Guarding Pain Intervention(s): Monitored during session    Home Living Family/patient expects to be discharged to:: Private residence Living Arrangements: Alone Available Help at Discharge: Friend(s);Available 24 hours/day Type of Home: House Home Access: Stairs to enter Entrance Stairs-Rails: Right Entrance Stairs-Number of Steps: 5 Home Layout: One level Home Equipment: None      Prior Function Level of Independence: Independent               Hand Dominance        Extremity/Trunk Assessment   Upper Extremity Assessment: Generalized weakness           Lower Extremity Assessment: Generalized weakness      Cervical / Trunk Assessment: Normal  Communication   Communication: No difficulties  Cognition Arousal/Alertness: Awake/alert   Overall Cognitive Status: Within Functional Limits for tasks assessed                      General Comments      Exercises         Assessment/Plan    PT Assessment Patient needs continued PT services  PT Diagnosis Difficulty walking;Generalized weakness;Acute pain   PT Problem List Decreased strength;Decreased activity tolerance;Decreased balance;Decreased mobility;Pain;Decreased knowledge of use of DME  PT Treatment Interventions DME instruction;Gait training;Stair training;Functional mobility training;Therapeutic activities;Therapeutic exercise;Balance training;Patient/family education   PT Goals (Current goals can be found in the Care Plan section) Acute Rehab PT Goals Patient Stated Goal: home PT Goal Formulation: With patient Time For Goal Achievement: 03/16/15 Potential to Achieve Goals: Good    Frequency Min 3X/week   Barriers to discharge        Co-evaluation               End of Session Equipment Utilized During Treatment: Gait belt Activity Tolerance: Patient tolerated treatment well Patient left: in bed;with call bell/phone within reach;with bed alarm set Nurse Communication: Mobility status         Time: 4098-1191 PT Time Calculation (min) (ACUTE ONLY): 31 min   Charges:   PT Evaluation $PT Eval Low Complexity: 1 Procedure PT Treatments $Gait Training: 8-22 mins   PT G Codes:        Ilda Foil 03/02/2015, 4:29 PM

## 2015-03-02 NOTE — Progress Notes (Signed)
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 08:09    Jeoffrey Massed, MD  [facility] Pager:[phone]  If 7PM-7AM, please contact night-coverage [email or web address] Password [email or web address] 03/02/2015, 1:49 PM   LOS: 6 days

## 2015-03-03 LAB — GLUCOSE, CAPILLARY
GLUCOSE-CAPILLARY: 124 mg/dL — AB (ref 65–99)
Glucose-Capillary: 156 mg/dL — ABNORMAL HIGH (ref 65–99)
Glucose-Capillary: 217 mg/dL — ABNORMAL HIGH (ref 65–99)
Glucose-Capillary: 93 mg/dL (ref 65–99)

## 2015-03-03 LAB — COMPREHENSIVE METABOLIC PANEL
ALT: 483 U/L — AB (ref 14–54)
AST: 310 U/L — AB (ref 15–41)
Albumin: 1.7 g/dL — ABNORMAL LOW (ref 3.5–5.0)
Alkaline Phosphatase: 190 U/L — ABNORMAL HIGH (ref 38–126)
Anion gap: 5 (ref 5–15)
BUN: 10 mg/dL (ref 6–20)
CALCIUM: 8.2 mg/dL — AB (ref 8.9–10.3)
CHLORIDE: 101 mmol/L (ref 101–111)
CO2: 24 mmol/L (ref 22–32)
CREATININE: 0.68 mg/dL (ref 0.44–1.00)
GFR calc Af Amer: >60 mL/min (ref >60)
Glucose, Bld: 112 mg/dL — ABNORMAL HIGH (ref 65–99)
Potassium: 3.7 mmol/L (ref 3.5–5.1)
SODIUM: 130 mmol/L — AB (ref 135–145)
TOTAL PROTEIN: 5.4 g/dL — AB (ref 6.5–8.1)
Total Bilirubin: 6.9 mg/dL — ABNORMAL HIGH (ref 0.3–1.2)

## 2015-03-03 LAB — CBC
HCT: 36.4 % (ref 36.0–46.0)
Hemoglobin: 13.6 g/dL (ref 12.0–15.0)
MCH: 32.7 pg (ref 26.0–34.0)
MCHC: 37.4 g/dL — ABNORMAL HIGH (ref 30.0–36.0)
MCV: 87.5 fL (ref 78.0–100.0)
PLATELETS: 134 10*3/uL — AB (ref 150–400)
RBC: 4.16 MIL/uL (ref 3.87–5.11)
RDW: 15 % (ref 11.5–15.5)
WBC: 11.9 10*3/uL — AB (ref 4.0–10.5)

## 2015-03-03 LAB — PROTIME-INR
INR: 1.47 (ref 0.00–1.49)
Prothrombin Time: 17.9 seconds — ABNORMAL HIGH (ref 11.6–15.2)

## 2015-03-03 LAB — AMMONIA: Ammonia: 34 umol/L (ref 9–35)

## 2015-03-03 NOTE — Progress Notes (Signed)
OT Cancellation Note  Patient Details Name: Alexandria Jacobson MRN: [id number] DOB: November 17, 1960   Cancelled Treatment:    Reason Eval/Treat Not Completed: Other (comment).  OT eating lunch, will try back for evaluation as time allows.  Evette Georges [phone] 03/03/2015, 1:57 PM

## 2015-03-03 NOTE — Evaluation (Signed)
Occupational Therapy Evaluation Patient Details Name: Alexandria Jacobson MRN: [id number] DOB: 1960/03/28 Today's Date: 03/03/2015    History of Present Illness Pt is a 55 year old female with PMH of HTN, Bronchitis, PE (previously on coumadin), and polysubstance abuse (alcohol, tobacco, cocaine). She presented to the ED with altered mental status and elevated liver function enzymes. Further evaluation revealed acute liver failure probably from acute hepatitis B, alcoholic hepatitis. Pt admitted for acute encephalopathy and acute liver failure.   Clinical Impression   This 55 yo female admitted with above presents to acute OT with deficits below thus affecting her ability to perform basic ADLs and IADLs at an independent to Mod I level as she was pta. She will benefit from acute OT with follow up HHOT.to get back to PLOF.    Follow Up Recommendations  Home health OT    Equipment Recommendations  None recommended by OT       Precautions / Restrictions Precautions Precautions: Fall Precaution Comments: "weak" back has to stop and rest on walker Restrictions Weight Bearing Restrictions: No      Mobility Bed Mobility Overal bed mobility: Modified Independent Bed Mobility: Supine to Sit     Supine to sit: Modified independent (Device/Increase time);HOB elevated        Transfers Overall transfer level: Needs assistance Equipment used: Rolling walker (2 wheeled) Transfers: Sit to/from Stand Sit to Stand: Min guard Stand pivot transfers: Min guard       General transfer comment: verbal cues for safety with RW    Balance Overall balance assessment: Needs assistance Sitting-balance support: No upper extremity supported;Feet supported Sitting balance-Leahy Scale: Good     Standing balance support: Bilateral upper extremity supported Standing balance-Leahy Scale: Poor Standing balance comment: Reliant on RW for ambulation                            ADL  Overall ADL's : Needs assistance/impaired Eating/Feeding: Independent;Sitting   Grooming: Set up;Sitting   Upper Body Bathing: Set up;Sitting   Lower Body Bathing: Min guard;Sit to/from stand   Upper Body Dressing : Set up;Sitting   Lower Body Dressing: Min guard;Sit to/from stand   Toilet Transfer: Min guard;Ambulation;RW (bed>162feet>recliner)   Toileting- Clothing Manipulation and Hygiene: Min guard;Sit to/from stand                         Pertinent Vitals/Pain Pain Assessment: Faces Faces Pain Scale: Hurts little more Pain Location: low back Pain Descriptors / Indicators: Aching;Sore Pain Intervention(s): Monitored during session;Repositioned     Hand Dominance Right   Extremity/Trunk Assessment Upper Extremity Assessment Upper Extremity Assessment: Generalized weakness   Lower Extremity Assessment Lower Extremity Assessment: Generalized weakness       Communication Communication Communication: No difficulties   Cognition Arousal/Alertness: Awake/alert Behavior During Therapy: Flat affect Overall Cognitive Status: Within Functional Limits for tasks assessed                                Home Living Family/patient expects to be discharged to:: Private residence Living Arrangements: Spouse/significant other Available Help at Discharge: Family;Available 24 hours/day Type of Home: House Home Access: Stairs to enter [employer] of Steps: 5 Entrance Stairs-Rails: Right Home Layout: One level     Bathroom Shower/Tub: Tub/shower unit (but pt states she sponge bathes since she can't stand long enought to shower  due to back issues)   Bathroom Toilet: Standard     Home Equipment: Bedside commode (but does not currently use per pt)          Prior Functioning/Environment Level of Independence: Independent             OT Diagnosis: Generalized weakness   OT Problem List: Decreased strength;Impaired balance (sitting  and/or standing);Obesity;Decreased knowledge of use of DME or AE   OT Treatment/Interventions: Self-care/ADL training;Patient/family education;Balance training;Therapeutic activities;DME and/or AE instruction    OT Goals(Current goals can be found in the care plan section) Acute Rehab OT Goals Patient Stated Goal: to go home OT Goal Formulation: With patient Time For Goal Achievement: 03/10/15 Potential to Achieve Goals: Good  OT Frequency: Min 2X/week   Barriers to D/C:  (husband can provide S but not physical A)             End of Session Equipment Utilized During Treatment: Gait belt;Rolling walker Nurse Communication: Mobility status (NT)  Activity Tolerance: Patient limited by fatigue (limited by weakness in back and legs) Patient left: in chair;with call bell/phone within reach;with chair alarm set   Time: 1438-1500 OT Time Calculation (min): 22 min Charges:  OT General Charges $OT Visit: 1 Procedure OT Evaluation $OT Eval Moderate Complexity: 1 Procedure  Evette Georges [phone] 03/03/2015, 3:35 PM

## 2015-03-03 NOTE — Progress Notes (Signed)
Patient requesting a sleep aide, states could not sleep last night, provider on call notified and orders entered into [facility] by provider for Ambien 5mg  QHS PRN for sleep. Olene Craven, RN

## 2015-03-03 NOTE — Progress Notes (Addendum)
CSW spoke briefly with pt re: SA issues/resources.  Pt unable to keep eyes open during conversation and "dozing off."  List of resources provided that will see pt without insurance, pt agreeable to f/u as OP.  Pollyann Savoy, LCSW Weekend Coverage [phone]

## 2015-03-03 NOTE — Progress Notes (Signed)
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 caudate lobe, although it possibly may represent artifact. Further evaluation with MRI on nonemergent basis is recommended. Electronically Signed   By: Lupita Raider, M.D.   On: 02/25/2015 08:09    Leroy Sea, MD  [facility] Pager:[phone]  If 7PM-7AM, please contact night-coverage [email or web address] Password [email or web address] 03/03/2015, 12:29 PM   LOS: 7 days

## 2015-03-04 LAB — GLUCOSE, CAPILLARY
GLUCOSE-CAPILLARY: 94 mg/dL (ref 65–99)
GLUCOSE-CAPILLARY: 142 mg/dL — AB (ref 65–99)

## 2015-03-04 LAB — HEPATITIS B E ANTIBODY: Hep B E Ab: POSITIVE — AB

## 2015-03-04 MED ORDER — CLONIDINE HCL 0.2 MG PO TABS: 0.2000 mg | ORAL_TABLET | Freq: Three times a day (TID) | ORAL | Status: DC

## 2015-03-04 MED ORDER — LACTULOSE 10 GM/15ML PO SOLN: 10.0000 g | Freq: Every day | ORAL | Status: DC

## 2015-03-04 MED ORDER — THIAMINE HCL 100 MG PO TABS: 100.0000 mg | ORAL_TABLET | Freq: Every day | ORAL | Status: DC

## 2015-03-04 MED ORDER — PANTOPRAZOLE SODIUM 40 MG PO TBEC: 40.0000 mg | DELAYED_RELEASE_TABLET | Freq: Every day | ORAL | Status: DC

## 2015-03-04 MED ORDER — FOLIC ACID 1 MG PO TABS: 1.0000 mg | ORAL_TABLET | Freq: Every day | ORAL | Status: DC

## 2015-03-04 MED ORDER — PROPRANOLOL HCL 10 MG PO TABS: 10.0000 mg | ORAL_TABLET | Freq: Three times a day (TID) | ORAL | Status: DC

## 2015-03-04 MED FILL — FOLIC ACID 1 MG TABLET: 1 | 30 days supply | Qty: 30 | Fill #0

## 2015-03-04 MED FILL — PROPRANOLOL 10 MG TABLET: 10 | 30 days supply | Qty: 90 | Fill #0

## 2015-03-04 MED FILL — ?CLONIDINE HCL 0.2 MG TABLE: 0.2 MG | 30 days supply | Qty: 90 | Fill #0

## 2015-03-04 MED FILL — ?PANTOPRAZOLE SOD DR 40MG: 40 MG | 30 days supply | Qty: 30 | Fill #0

## 2015-03-04 MED FILL — LACTULOSE 10 GM/15 ML SOLN: 10 | 16 days supply | Qty: 240 | Fill #0

## 2015-03-04 NOTE — Discharge Instructions (Signed)
Follow with Primary MD LEE, JUSTIN, MD in 7 days   Get CBC, CMP, 2 view Chest X ray checked  by Primary MD next visit.    Activity: As tolerated with Full fall precautions use walker/cane & assistance as needed   Disposition Home    Diet:   Heart Healthy dysphagia 3 diet with aspiration precautions  For Heart failure patients - Check your Weight same time everyday, if you gain over 2 pounds, or you develop in leg swelling, experience more shortness of breath or chest pain, call your Primary MD immediately. Follow Cardiac Low Salt Diet and 1.5 lit/day fluid restriction.   On your next visit with your primary care physician please Get Medicines reviewed and adjusted.   Please request your Prim.MD to go over all Hospital Tests and Procedure/Radiological results at the follow up, please get all Hospital records sent to your Prim MD by signing hospital release before you go home.   If you experience worsening of your admission symptoms, develop shortness of breath, life threatening emergency, suicidal or homicidal thoughts you must seek medical attention immediately by calling 911 or calling your MD immediately  if symptoms less severe.  You Must read complete instructions/literature along with all the possible adverse reactions/side effects for all the Medicines you take and that have been prescribed to you. Take any new Medicines after you have completely understood and accpet all the possible adverse reactions/side effects.   Do not drive, operating heavy machinery, perform activities at heights, swimming or participation in water activities or provide baby sitting services if your were admitted for syncope or siezures until you have seen by Primary MD or a Neurologist and advised to do so again.  Do not drive when taking Pain medications.    Do not take more than prescribed Pain, Sleep and Anxiety Medications  Special Instructions: If you have smoked or chewed Tobacco  in the last 2  yrs please stop smoking, stop any regular Alcohol  and or any Recreational drug use.  Wear Seat belts while driving.   Please note  You were cared for by a hospitalist during your hospital stay. If you have any questions about your discharge medications or the care you received while you were in the hospital after you are discharged, you can call the unit and asked to speak with the hospitalist on call if the hospitalist that took care of you is not available. Once you are discharged, your primary care physician will handle any further medical issues. Please note that NO REFILLS for any discharge medications will be authorized once you are discharged, as it is imperative that you return to your primary care physician (or establish a relationship with a primary care physician if you do not have one) for your aftercare needs so that they can reassess your need for medications and monitor your lab values.

## 2015-03-04 NOTE — Progress Notes (Signed)
NURSING PROGRESS NOTE  Alexandria Jacobson [id number] Discharge Data: 03/04/2015 12:37 PM Attending Provider: Leroy Sea, MD PCP:LEE, Jill Alexanders, MD     [name] to be D/C'd Home per MD order.  Discussed with the patient the After Visit Summary and all questions fully answered. All IV's discontinued with no bleeding noted. All belongings returned to patient for patient to take home. Patient states her ride should be here after 3:00 pm to pick her up.  Last Vital Signs:  Blood pressure 112/73, pulse 63, temperature 97.9 F (36.6 C), temperature source Oral, resp. rate 20, height 5\' 5"  (1.651 m), weight 83.3 kg (183 lb 10.3 oz), SpO2 95 %.  Discharge Medication List   Medication List    STOP taking these medications        cephALEXin 500 MG capsule  Commonly known as:  KEFLEX     ibuprofen 200 MG tablet  Commonly known as:  ADVIL,MOTRIN      TAKE these medications        albuterol 108 (90 Base) MCG/ACT inhaler  Commonly known as:  PROVENTIL HFA;VENTOLIN HFA  Inhale 2 puffs into the lungs every 6 (six) hours as needed for wheezing or shortness of breath. breathing     cloNIDine 0.2 MG tablet  Commonly known as:  CATAPRES  Take 1 tablet (0.2 mg total) by mouth 3 (three) times daily.     folic acid 1 MG tablet  Commonly known as:  FOLVITE  Take 1 tablet (1 mg total) by mouth daily.     lactulose 10 GM/15ML solution  Commonly known as:  CHRONULAC  Take 15 mLs (10 g total) by mouth daily.     pantoprazole 40 MG tablet  Commonly known as:  PROTONIX  Take 1 tablet (40 mg total) by mouth at bedtime.     propranolol 10 MG tablet  Commonly known as:  INDERAL  Take 1 tablet (10 mg total) by mouth 3 (three) times daily.     thiamine 100 MG tablet  Take 1 tablet (100 mg total) by mouth daily.         Roma Kayser, RN

## 2015-03-04 NOTE — Discharge Summary (Signed)
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 SENSITIVE Sensitive     TRIMETH/SULFA <=20 SENSITIVE Sensitive     AMPICILLIN/SULBACTAM >=32 RESISTANT Resistant     PIP/TAZO <=4 SENSITIVE Sensitive     * >=100,000 COLONIES/mL ESCHERICHIA COLI  MRSA PCR Screening     Status: None   Collection Time: 02/25/15 12:55 AM  Result Value Ref Range Status   MRSA by PCR NEGATIVE NEGATIVE Final    Comment:        The GeneXpert MRSA Assay (FDA approved for NASAL specimens only), is one component of a comprehensive MRSA colonization surveillance program. It is not intended to diagnose MRSA infection nor to guide or monitor treatment for MRSA infections.     Today   Subjective    Riah Warburton today has no headache,no chest abdominal pain,no new weakness tingling or numbness, feels much better wants to go  home today.     Objective   Blood pressure 112/73, pulse 63, temperature 97.9 F (36.6 C), temperature source Oral,  resp. rate 20, height  (1.651 m), weight 83.3 kg (183 lb 10.3 oz), SpO2 95 %.   Intake/Output Summary (Last 24 hours) at 03/04/15 0919 Last data filed at 03/04/15 0600  Gross per 24 hour  Intake    490 ml  Output      1 ml  Net    489 ml    Exam Awake Alert, Oriented x 3, No new F.N deficits, Normal affect Kulm.AT,PERRAL Supple Neck,No JVD, No cervical lymphadenopathy appriciated.  Symmetrical Chest wall movement, Good air movement bilaterally, CTAB RRR,No Gallops,Rubs or new Murmurs, No Parasternal Heave +ve B.Sounds, Abd Soft, Non tender, No organomegaly appriciated, No rebound -guarding or rigidity. No Cyanosis, Clubbing or edema, No new Rash or bruise   Data Review   CBC w Diff: Lab Results  Component Value Date   WBC 11.9* 03/03/2015   HGB 13.6 03/03/2015   HCT 36.4 03/03/2015   PLT 134* 03/03/2015   LYMPHOPCT 15 02/24/2015   BANDSPCT 15 02/24/2015   MONOPCT 13 02/24/2015   EOSPCT 0 02/24/2015   BASOPCT 1 02/24/2015    CMP: Lab Results  Component Value Date   NA 130* 03/03/2015   K 3.7 03/03/2015   CL 101 03/03/2015   CO2 24 03/03/2015   BUN 10 03/03/2015   CREATININE 0.68 03/03/2015   PROT 5.4* 03/03/2015   ALBUMIN 1.7* 03/03/2015   BILITOT 6.9* 03/03/2015   ALKPHOS 190* 03/03/2015   AST 310* 03/03/2015   ALT 483* 03/03/2015  .   Total Time in preparing paper work, data evaluation and todays exam - 35 minutes  Leroy Sea M.D on 03/04/2015 at 9:19 AM  [facility]   Office  [phone]

## 2015-03-04 NOTE — Progress Notes (Signed)
Occupational Therapy Treatment Patient Details Name: Alexandria Jacobson MRN: [id number] DOB: Jan 29, 1961 Today's Date: 03/04/2015    History of present illness Pt is a 55 year old female with PMH of HTN, Bronchitis, PE (previously on coumadin), and polysubstance abuse (alcohol, tobacco, cocaine). She presented to the ED with altered mental status and elevated liver function enzymes. Further evaluation revealed acute liver failure probably from acute hepatitis B, alcoholic hepatitis. Pt admitted for acute encephalopathy and acute liver failure.   OT comments  Pt. Making gains with acute OT goals.  Able to amb. In room and complete LB adls and toileting tasks.  Will continue to follow acutely.    Follow Up Recommendations  Home health OT    Equipment Recommendations  None recommended by OT    Recommendations for Other Services      Precautions / Restrictions Precautions Precautions: Fall       Mobility Bed Mobility Overal bed mobility: Modified Independent Bed Mobility: Supine to Sit     Supine to sit: Modified independent (Device/Increase time)     General bed mobility comments: hob flat intermittent use of rails, encouraged her not to use the rails in preparation for bed mobility at home from a regular bed  Transfers Overall transfer level: Needs assistance Equipment used: Rolling walker (2 wheeled) Transfers: Sit to/from [employer] Sit to Stand: Min guard Stand pivot transfers: Min guard       General transfer comment: verbal cues for safety with RW    Balance                                   ADL Overall ADL's : Needs assistance/impaired             Lower Body Bathing: Sitting/lateral leans Lower Body Bathing Details (indicate cue type and reason): pt. washed peri area and buttocks while seated with lateral leans     Lower Body Dressing: Supervision/safety;Sitting/lateral leans Lower Body Dressing Details (indicate cue type  and reason): adjusted and donned socks while in sitting eob Toilet Transfer: Min guard;Ambulation;RW;Regular Social worker and Hygiene: Min guard;Sit to/from stand       Functional mobility during ADLs: Min guard General ADL Comments: pt. moves slow stating it is secondary to "weakness".  able to amb. to/from b.room and complete toileting tasks      Vision                     Perception     Praxis      Cognition   Behavior During Therapy: Flat affect Overall Cognitive Status: Within Functional Limits for tasks assessed                       Extremity/Trunk Assessment               Exercises     Shoulder Instructions       General Comments      Pertinent Vitals/ Pain       Pain Assessment: No/denies pain  Home Living                                          Prior Functioning/Environment              Frequency  Progress Toward Goals  OT Goals(current goals can now be found in the care plan section)  Progress towards OT goals: Progressing toward goals     Plan Discharge plan remains appropriate    Co-evaluation                 End of Session Equipment Utilized During Treatment: Gait belt;Rolling walker   Activity Tolerance Patient limited by fatigue   Patient Left in chair;with call bell/phone within reach;with chair alarm set   Nurse Communication          Time: [phone] OT Time Calculation (min): 23 min  Charges: OT General Charges $OT Visit: 1 Procedure OT Treatments $Self Care/Home Management : 23-37 mins  Robet Leu, COTA/L 03/04/2015, 8:33 AM

## 2015-03-04 NOTE — Progress Notes (Addendum)
CM received consult :Any dme needed. Per PT's  Recommendation: rolling walker. Pt requesting 3 in 1 commomde. Pt without any  Insurance. CM called [facility]/ [facility]erm[facility] and referral made for rolling walker, 3 in 1/ charity case. Jermaine to deliver walker and 3 in 1 to pt's bedside prior to d/c. Pt made aware. Gae Gallop RN, [place] [phone]
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
## 2015-03-07 LAB — NGI HBV SUPERQUANT: Hepatitis B DNA: 460000 Copies/mL

## 2015-03-08 ENCOUNTER — Ambulatory Visit: Payer: Self-pay | Attending: Family Medicine | Admitting: Physician Assistant

## 2015-03-08 VITALS — BP 127/82 | HR 85 | Temp 97.9°F | Resp 24 | Ht 65.0 in | Wt 194.0 lb

## 2015-03-08 DIAGNOSIS — E8770 Fluid overload, unspecified: Secondary | ICD-10-CM | POA: Insufficient documentation

## 2015-03-08 DIAGNOSIS — R0600 Dyspnea, unspecified: Secondary | ICD-10-CM | POA: Insufficient documentation

## 2015-03-08 DIAGNOSIS — B169 Acute hepatitis B without delta-agent and without hepatic coma: Secondary | ICD-10-CM

## 2015-03-08 DIAGNOSIS — J45909 Unspecified asthma, uncomplicated: Secondary | ICD-10-CM | POA: Insufficient documentation

## 2015-03-08 DIAGNOSIS — K7200 Acute and subacute hepatic failure without coma: Secondary | ICD-10-CM | POA: Insufficient documentation

## 2015-03-08 DIAGNOSIS — F1910 Other psychoactive substance abuse, uncomplicated: Secondary | ICD-10-CM | POA: Insufficient documentation

## 2015-03-08 DIAGNOSIS — Z9889 Other specified postprocedural states: Secondary | ICD-10-CM | POA: Insufficient documentation

## 2015-03-08 DIAGNOSIS — I10 Essential (primary) hypertension: Secondary | ICD-10-CM | POA: Insufficient documentation

## 2015-03-08 DIAGNOSIS — M1990 Unspecified osteoarthritis, unspecified site: Secondary | ICD-10-CM | POA: Insufficient documentation

## 2015-03-08 DIAGNOSIS — Z79899 Other long term (current) drug therapy: Secondary | ICD-10-CM | POA: Insufficient documentation

## 2015-03-08 DIAGNOSIS — Z86711 Personal history of pulmonary embolism: Secondary | ICD-10-CM | POA: Insufficient documentation

## 2015-03-08 LAB — CMP AND LIVER
ALBUMIN: 2 g/dL — AB (ref 3.6–5.1)
ALT: 112 U/L — ABNORMAL HIGH (ref 6–29)
AST: 75 U/L — AB (ref 10–35)
Alkaline Phosphatase: 227 U/L — ABNORMAL HIGH (ref 33–130)
BUN: 5 mg/dL — AB (ref 7–25)
Bilirubin, Direct: 2.7 mg/dL — ABNORMAL HIGH (ref <=0.2)
CO2: 24 mmol/L (ref 20–31)
Calcium: 7.7 mg/dL — ABNORMAL LOW (ref 8.6–10.4)
Chloride: 104 mmol/L (ref 98–110)
Creat: 0.51 mg/dL (ref 0.50–1.05)
GLUCOSE: 129 mg/dL — AB (ref 65–99)
Indirect Bilirubin: 1.8 mg/dL — ABNORMAL HIGH (ref 0.2–1.2)
POTASSIUM: 4.3 mmol/L (ref 3.5–5.3)
SODIUM: 136 mmol/L (ref 135–146)
Total Bilirubin: 4.5 mg/dL — ABNORMAL HIGH (ref 0.2–1.2)
Total Protein: 4.9 g/dL — ABNORMAL LOW (ref 6.1–8.1)

## 2015-03-08 MED ORDER — LACTULOSE 10 GM/15ML PO SOLN: 10.0000 g | Freq: Every day | ORAL | Status: DC

## 2015-03-08 MED ORDER — FUROSEMIDE 10 MG/ML IJ SOLN
80.0000 mg | Freq: Once | INTRAMUSCULAR | Status: AC
  Administered 2015-03-08: 80 mg via INTRAVENOUS

## 2015-03-08 MED ORDER — ALBUTEROL SULFATE HFA 108 (90 BASE) MCG/ACT IN AERS: 2.0000 | INHALATION_SPRAY | Freq: Four times a day (QID) | RESPIRATORY_TRACT | Status: DC | PRN

## 2015-03-08 MED ORDER — FUROSEMIDE 40 MG PO TABS: 40.0000 mg | ORAL_TABLET | Freq: Every day | ORAL | Status: DC

## 2015-03-08 MED ORDER — SPIRONOLACTONE 50 MG PO TABS: 50.0000 mg | ORAL_TABLET | Freq: Every day | ORAL | Status: DC

## 2015-03-08 MED FILL — ?FUROSEMIDE 40 MG TABLET: 40 | 30 days supply | Qty: 30 | Fill #0

## 2015-03-08 MED FILL — VENTOLIN HFA 90 MCG INHALER: 108 (90 BAS | 25 days supply | Qty: 18 | Fill #0

## 2015-03-08 MED FILL — ?SPIRONOLACTONE 50 MG TABLE: 50 | 30 days supply | Qty: 30 | Fill #0

## 2015-03-08 NOTE — Addendum Note (Signed)
Addended byVivianne Master on: 03/08/2015 02:41 PM   Modules accepted: Orders

## 2015-03-08 NOTE — Progress Notes (Signed)
Patient was hospitalized, 1/15-1/23 for pancreatitis and liver problems. Patient complains of left hip pain from fall from last year on ice in 2015. Legs started swelling up top, down to feet, yesterday. Patient denies pain at this time.   Patient reports getting very short winded.  Patient needs refill on albuterol inhaler and thiamine.

## 2015-03-08 NOTE — Patient Instructions (Signed)
Only drink water with meals until we see you. No excessive fluid intake. Return on Monday for your appt. Go to ED if symptoms worsen!! Keep legs elevated this weekend.  Take all meds as prescribed!
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
## 2015-03-09 LAB — CBC WITH DIFFERENTIAL/PLATELET
BASOS ABS: 0.1 10*3/uL (ref 0.0–0.1)
BASOS PCT: 1 % (ref 0–1)
Eosinophils Absolute: 0 10*3/uL (ref 0.0–0.7)
Eosinophils Relative: 0 % (ref 0–5)
HCT: 33.3 % — ABNORMAL LOW (ref 36.0–46.0)
HEMOGLOBIN: 11.1 g/dL — AB (ref 12.0–15.0)
Lymphocytes Relative: 12 % (ref 12–46)
Lymphs Abs: 0.9 10*3/uL (ref 0.7–4.0)
MCH: 32.8 pg (ref 26.0–34.0)
MCHC: 33.3 g/dL (ref 30.0–36.0)
MCV: 98.5 fL (ref 78.0–100.0)
MONO ABS: 0.4 10*3/uL (ref 0.1–1.0)
MPV: 12 fL (ref 8.6–12.4)
Monocytes Relative: 5 % (ref 3–12)
NEUTROS ABS: 6.3 10*3/uL (ref 1.7–7.7)
NEUTROS PCT: 82 % — AB (ref 43–77)
Platelets: 130 10*3/uL — ABNORMAL LOW (ref 150–400)
RBC: 3.38 MIL/uL — AB (ref 3.87–5.11)
RDW: 15.4 % (ref 11.5–15.5)
WBC: 7.7 10*3/uL (ref 4.0–10.5)

## 2015-03-09 LAB — DRUG SCREEN, URINE
AMPHETAMINE SCRN UR: NEGATIVE
BARBITURATE QUANT UR: NEGATIVE
Benzodiazepines.: NEGATIVE
COCAINE METABOLITES: POSITIVE — AB
Creatinine,U: 29.25 mg/dL
Marijuana Metabolite: NEGATIVE
Methadone: NEGATIVE
OPIATES: NEGATIVE
Phencyclidine (PCP): NEGATIVE
Propoxyphene: NEGATIVE

## 2015-03-11 ENCOUNTER — Encounter: Payer: Self-pay | Admitting: Family Medicine

## 2015-03-11 ENCOUNTER — Ambulatory Visit: Payer: Self-pay | Attending: Family Medicine | Admitting: Family Medicine

## 2015-03-11 VITALS — BP 107/68 | HR 74 | Temp 97.6°F | Resp 16 | Ht 65.0 in | Wt 179.0 lb

## 2015-03-11 DIAGNOSIS — K7200 Acute and subacute hepatic failure without coma: Secondary | ICD-10-CM

## 2015-03-11 DIAGNOSIS — F1410 Cocaine abuse, uncomplicated: Secondary | ICD-10-CM

## 2015-03-11 DIAGNOSIS — Z131 Encounter for screening for diabetes mellitus: Secondary | ICD-10-CM

## 2015-03-11 DIAGNOSIS — R0600 Dyspnea, unspecified: Secondary | ICD-10-CM | POA: Insufficient documentation

## 2015-03-11 DIAGNOSIS — K7291 Hepatic failure, unspecified with coma: Secondary | ICD-10-CM | POA: Insufficient documentation

## 2015-03-11 DIAGNOSIS — N179 Acute kidney failure, unspecified: Secondary | ICD-10-CM

## 2015-03-11 DIAGNOSIS — B160 Acute hepatitis B with delta-agent with hepatic coma: Secondary | ICD-10-CM

## 2015-03-11 DIAGNOSIS — I10 Essential (primary) hypertension: Secondary | ICD-10-CM

## 2015-03-11 DIAGNOSIS — Z79899 Other long term (current) drug therapy: Secondary | ICD-10-CM | POA: Insufficient documentation

## 2015-03-11 DIAGNOSIS — B1911 Unspecified viral hepatitis B with hepatic coma: Secondary | ICD-10-CM

## 2015-03-11 LAB — POCT GLYCOSYLATED HEMOGLOBIN (HGB A1C): HEMOGLOBIN A1C: 4.8

## 2015-03-11 MED FILL — LACTULOSE 10 GM/15 ML SOLN: 10 | 16 days supply | Qty: 240 | Fill #0

## 2015-03-11 NOTE — Progress Notes (Signed)
Subjective:  Patient ID: Alexandria Jacobson, female    DOB: 05-11-1960  Age: 55 y.o. MRN: [id number]  CC: Establish Care and Follow-up   HPI Alexandria Jacobson is a 55 year old female with a history of hypertension, cocaine abuse, recently hospitalized at [facility] from 02/24/15-03/04/15 for sepsis and acute encephalopathy secondary to acute hepatic failure and alcoholic hepatitis at which time she also had acute kidney injury and pancreatitis with labs positive for lactic acidosis, hyperammonemia, elevated lipase and positive for trichomonas vaginalis. Hepatitis B surface antigen,Hep B e ab, Hep B core ab were all positive. Viral load was 460,000  At her hospital follow-up visit 3 days ago she was severely short of breath and received 80 mg of IV Lasix in the clinic and was placed on Lasix 40 mg daily and a chest x-ray ordered. She never picked up her Lasix and never had a chest x-ray done but states that shortness of breath is a bit better. She is accompanied by her daughter and states her gait is not stable and she has to use a walker to ambulate. She was supposed to follow-up with GI outpatient which she never did.  Outpatient Prescriptions Prior to Visit  Medication Sig Dispense Refill  . albuterol (PROVENTIL HFA;VENTOLIN HFA) 108 (90 Base) MCG/ACT inhaler Inhale 2 puffs into the lungs every 6 (six) hours as needed for wheezing or shortness of breath. 1 Inhaler 0  . cloNIDine (CATAPRES) 0.2 MG tablet Take 1 tablet (0.2 mg total) by mouth 3 (three) times daily. 90 tablet 0  . folic acid (FOLVITE) 1 MG tablet Take 1 tablet (1 mg total) by mouth daily. 30 tablet 0  . furosemide (LASIX) 40 MG tablet Take 1 tablet (40 mg total) by mouth daily. 30 tablet 3  . lactulose (CHRONULAC) 10 GM/15ML solution Take 15 mLs (10 g total) by mouth daily. 240 mL 0  . pantoprazole (PROTONIX) 40 MG tablet Take 1 tablet (40 mg total) by mouth at bedtime. 30 tablet 0  . propranolol (INDERAL) 10 MG tablet  Take 1 tablet (10 mg total) by mouth 3 (three) times daily. 90 tablet 0  . spironolactone (ALDACTONE) 50 MG tablet Take 1 tablet (50 mg total) by mouth daily. 30 tablet 3  . thiamine 100 MG tablet Take 1 tablet (100 mg total) by mouth daily. 30 tablet 0   No facility-administered medications prior to visit.    ROS Review of Systems  Constitutional: Negative for activity change, appetite change and fatigue.  HENT: Negative for congestion, sinus pressure and sore throat.   Eyes: Negative for visual disturbance.  Respiratory: Positive for shortness of breath. Negative for cough, chest tightness and wheezing.   Cardiovascular: Positive for leg swelling. Negative for chest pain and palpitations.  Gastrointestinal: Negative for abdominal pain, constipation and abdominal distention.  Endocrine: Negative for polydipsia.  Genitourinary: Negative for dysuria and frequency.  Musculoskeletal: Negative for back pain and arthralgias.  Skin: Negative for rash.  Neurological: Negative for tremors, light-headedness and numbness.  Hematological: Does not bruise/bleed easily.  Psychiatric/Behavioral: Negative for behavioral problems and agitation.    Objective:  BP 107/68 mmHg  Pulse 74  Temp(Src) 97.6 F (36.4 C) (Oral)  Resp 16  Ht 5\' 5"  (1.651 m)  Wt 179 lb (81.194 kg)  BMI 29.79 kg/m2  SpO2 98%  BP/Weight 03/11/2015 03/08/2015 03/04/2015  Systolic BP 107 127 125  Diastolic BP 68 82 68  Wt. (Lbs) 179 194 183.64  BMI 29.79 32.28 30.56  CMP Latest Ref Rng 03/08/2015 03/03/2015 03/02/2015  Glucose 65 - 99 mg/dL 782(N) 562(Z) 308(M)  BUN 7 - 25 mg/dL 5(L) 10 9  Creatinine 5.78 - 1.05 mg/dL 4.69 6.29 5.28  Sodium 135 - 146 mmol/L 136 130(L) 130(L)  Potassium 3.5 - 5.3 mmol/L 4.3 3.7 4.0  Chloride 98 - 110 mmol/L 104 101 102  CO2 20 - 31 mmol/L Calcium 8.6 - 10.4 mg/dL 7.7(L) 8.2(L) 8.4(L)  Total Protein 6.1 - 8.1 g/dL 4.9(L) 5.4(L) 5.4(L)  Total Bilirubin 0.2 - 1.2 mg/dL 4.5(H)  6.9(H) 7.6(H)  Alkaline Phos 33 - 130 U/L 227(H) 190(H) 151(H)  AST 10 - 35 U/L 75(H) 310(H) 401(H)  ALT 6 - 29 U/L 112(H) 483(H) 616(H)      Physical Exam  Constitutional: She is oriented to person, place, and time. She appears well-developed and well-nourished.  Eyes: Scleral icterus is present.  Neck: JVD present.  Cardiovascular: Normal rate, normal heart sounds and intact distal pulses.   No murmur heard. Pulmonary/Chest: Effort normal and breath sounds normal. She has no wheezes. She has no rales. She exhibits no tenderness.  Abdominal: Soft. Bowel sounds are normal. She exhibits distension (Mild abdominal distention). She exhibits no mass. There is no tenderness.  Musculoskeletal: Normal range of motion. She exhibits edema (2+ bilateral PT and pedal edema).  Neurological: She is alert and oriented to person, place, and time.     Assessment & Plan:   1. Diabetes mellitus screening A1c 4.8-controlled - POCT A1C  2. Essential hypertension Controlled  3. Cocaine abuse Patient is still using recreational drugs and I have counseled her against this  4. AKI (acute kidney injury) (HCC) Resolved  5. Acute liver failure Liver enzymes trending down She was to follow-up with GI outpatient however she does not have the [place] discount which I have encouraged her to apply for to facilitate this referral Yet to pick up lactulose which I have advised her to do.  6. Viral hepatitis B with hepatitis delta, with hepatic coma, unspecified chronicity Recheck hepatitis B surface antibody in 6 months for seroconversion  7. Dyspnea Does not seem to be in distress even though she is yet to pick up the Lasix which was prescribed at her last visit. Could have been secondary to deconditioning as well. Advised to pickup Lasix from the pharmacy and I will reassess at her next office visit  No orders of the defined types were placed in this encounter.    Follow-up: Return in about 1  week (around 03/18/2015) for Follow-up of acute hepatic failure.   Jaclyn Shaggy MD

## 2015-03-11 NOTE — Progress Notes (Signed)
Patient's here to est care, and follow up hypervolemia.  Patient denies any pain today.  Patient agree to diabetes screening today.  Requesting  lab results from last OV to be discussed with patient.

## 2015-03-11 NOTE — Patient Instructions (Signed)

## 2015-03-18 ENCOUNTER — Other Ambulatory Visit: Payer: Self-pay | Admitting: Internal Medicine

## 2015-03-18 MED ORDER — ALBUTEROL SULFATE HFA 108 (90 BASE) MCG/ACT IN AERS: 2.0000 | INHALATION_SPRAY | Freq: Four times a day (QID) | RESPIRATORY_TRACT | Status: DC | PRN

## 2015-03-20 ENCOUNTER — Ambulatory Visit: Payer: Self-pay | Admitting: Family Medicine

## 2015-03-22 ENCOUNTER — Ambulatory Visit: Payer: Self-pay | Admitting: Family Medicine

## 2015-03-25 ENCOUNTER — Encounter: Payer: Self-pay | Admitting: Family Medicine

## 2015-03-25 ENCOUNTER — Ambulatory Visit: Payer: Self-pay | Attending: Family Medicine | Admitting: Family Medicine

## 2015-03-25 VITALS — BP 126/84 | HR 100 | Temp 97.8°F | Resp 15 | Ht 65.0 in | Wt 164.0 lb

## 2015-03-25 DIAGNOSIS — K7010 Alcoholic hepatitis without ascites: Secondary | ICD-10-CM | POA: Insufficient documentation

## 2015-03-25 DIAGNOSIS — B161 Acute hepatitis B with delta-agent without hepatic coma: Secondary | ICD-10-CM

## 2015-03-25 DIAGNOSIS — N179 Acute kidney failure, unspecified: Secondary | ICD-10-CM | POA: Insufficient documentation

## 2015-03-25 DIAGNOSIS — R748 Abnormal levels of other serum enzymes: Secondary | ICD-10-CM | POA: Insufficient documentation

## 2015-03-25 DIAGNOSIS — J45909 Unspecified asthma, uncomplicated: Secondary | ICD-10-CM | POA: Insufficient documentation

## 2015-03-25 DIAGNOSIS — G9340 Encephalopathy, unspecified: Secondary | ICD-10-CM | POA: Insufficient documentation

## 2015-03-25 DIAGNOSIS — F1410 Cocaine abuse, uncomplicated: Secondary | ICD-10-CM | POA: Insufficient documentation

## 2015-03-25 DIAGNOSIS — K219 Gastro-esophageal reflux disease without esophagitis: Secondary | ICD-10-CM

## 2015-03-25 DIAGNOSIS — K859 Acute pancreatitis without necrosis or infection, unspecified: Secondary | ICD-10-CM | POA: Insufficient documentation

## 2015-03-25 DIAGNOSIS — K7200 Acute and subacute hepatic failure without coma: Secondary | ICD-10-CM | POA: Insufficient documentation

## 2015-03-25 DIAGNOSIS — I10 Essential (primary) hypertension: Secondary | ICD-10-CM | POA: Insufficient documentation

## 2015-03-25 DIAGNOSIS — Z131 Encounter for screening for diabetes mellitus: Secondary | ICD-10-CM | POA: Insufficient documentation

## 2015-03-25 DIAGNOSIS — K7291 Hepatic failure, unspecified with coma: Secondary | ICD-10-CM | POA: Insufficient documentation

## 2015-03-25 DIAGNOSIS — F17210 Nicotine dependence, cigarettes, uncomplicated: Secondary | ICD-10-CM | POA: Insufficient documentation

## 2015-03-25 DIAGNOSIS — Z86711 Personal history of pulmonary embolism: Secondary | ICD-10-CM | POA: Insufficient documentation

## 2015-03-25 DIAGNOSIS — M1990 Unspecified osteoarthritis, unspecified site: Secondary | ICD-10-CM | POA: Insufficient documentation

## 2015-03-25 LAB — COMPLETE METABOLIC PANEL WITH GFR
ALBUMIN: 2.9 g/dL — AB (ref 3.6–5.1)
ALK PHOS: 159 U/L — AB (ref 33–130)
ALT: 44 U/L — AB (ref 6–29)
AST: 63 U/L — ABNORMAL HIGH (ref 10–35)
BILIRUBIN TOTAL: 2 mg/dL — AB (ref 0.2–1.2)
BUN: 14 mg/dL (ref 7–25)
CALCIUM: 9.3 mg/dL (ref 8.6–10.4)
CHLORIDE: 95 mmol/L — AB (ref 98–110)
CO2: 31 mmol/L (ref 20–31)
CREATININE: 0.59 mg/dL (ref 0.50–1.05)
GFR, Est Non African American: >89 mL/min (ref >=60)
Glucose, Bld: 89 mg/dL (ref 65–99)
Potassium: 4.6 mmol/L (ref 3.5–5.3)
Sodium: 132 mmol/L — ABNORMAL LOW (ref 135–146)
TOTAL PROTEIN: 7.3 g/dL (ref 6.1–8.1)

## 2015-03-25 MED ORDER — PROPRANOLOL HCL 10 MG PO TABS: 10.0000 mg | ORAL_TABLET | Freq: Three times a day (TID) | ORAL | Status: DC

## 2015-03-25 MED ORDER — LACTULOSE 10 GM/15ML PO SOLN: 10.0000 g | Freq: Two times a day (BID) | ORAL | Status: DC | PRN

## 2015-03-25 MED ORDER — CLONIDINE HCL 0.2 MG PO TABS: 0.2000 mg | ORAL_TABLET | Freq: Three times a day (TID) | ORAL | Status: DC

## 2015-03-25 MED ORDER — FUROSEMIDE 20 MG PO TABS: 20.0000 mg | ORAL_TABLET | Freq: Every day | ORAL | Status: DC

## 2015-03-25 MED ORDER — PANTOPRAZOLE SODIUM 40 MG PO TBEC: 40.0000 mg | DELAYED_RELEASE_TABLET | Freq: Every day | ORAL | Status: DC

## 2015-03-25 MED ORDER — SPIRONOLACTONE 25 MG PO TABS: 25.0000 mg | ORAL_TABLET | Freq: Every day | ORAL | Status: DC

## 2015-03-25 MED FILL — PROPRANOLOL 10 MG TABLET: 10 | 30 days supply | Qty: 90 | Fill #0

## 2015-03-25 MED FILL — LACTULOSE 10 GM/15 ML SOLN: 10 | 63 days supply | Qty: 1892 | Fill #0

## 2015-03-25 MED FILL — cloNIDine HCL 0.2 MG TABS: 0.2 | 30 days supply | Qty: 90 | Fill #0

## 2015-03-25 MED FILL — ?FUROSEMIDE 20 MG TABLET: 20 | 30 days supply | Qty: 30 | Fill #0

## 2015-03-25 MED FILL — ?PANTOPRAZOLE SOD DR 40MG: 40 MG | 30 days supply | Qty: 30 | Fill #0

## 2015-03-25 NOTE — Progress Notes (Signed)
Patient reports doing fine with no pain Reports she needs a refill on her Lactulose (this RN does not see where she has been prescribed this) She also states she does not think she takes aldactone
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
## 2015-03-26 ENCOUNTER — Telehealth: Payer: Self-pay | Admitting: *Deleted

## 2015-03-26 NOTE — Telephone Encounter (Signed)
-----   Message from Jaclyn Shaggy, MD sent at 03/26/2015  8:42 AM EST ----- Liver enzymes are trending down

## 2015-03-26 NOTE — Telephone Encounter (Signed)
Spoke to patient, verified name and date of birth and gave results

## 2015-04-29 ENCOUNTER — Other Ambulatory Visit: Payer: Self-pay | Admitting: Physician Assistant

## 2015-04-29 MED FILL — SPIRONOLACTONE 25 MG TABLET: 25 | 30 days supply | Qty: 30 | Fill #0

## 2015-04-29 MED FILL — ?FUROSEMIDE 20 MG TABLET: 20 | 30 days supply | Qty: 30 | Fill #1

## 2015-04-29 MED FILL — ?PANTOPRAZOLE SOD DR 40MG: 40 MG | 30 days supply | Qty: 30 | Fill #1

## 2015-05-31 DIAGNOSIS — Z515 Encounter for palliative care: Secondary | ICD-10-CM | POA: Insufficient documentation

## 2015-07-30 ENCOUNTER — Ambulatory Visit: Payer: Self-pay | Attending: Family Medicine

## 2015-09-04 ENCOUNTER — Ambulatory Visit (HOSPITAL_COMMUNITY)
Admission: EM | Admit: 2015-09-04 | Discharge: 2015-09-04 | Disposition: A | Discharge status: Home / Self Care | Payer: Self-pay | Attending: Physician Assistant | Admitting: Physician Assistant

## 2015-09-04 ENCOUNTER — Encounter (HOSPITAL_COMMUNITY): Payer: Self-pay | Admitting: Emergency Medicine

## 2015-09-04 DIAGNOSIS — M778 Other enthesopathies, not elsewhere classified: Secondary | ICD-10-CM

## 2015-09-04 MED ORDER — SODIUM CHLORIDE 0.9 % IN NEBU
INHALATION_SOLUTION | RESPIRATORY_TRACT | Status: AC
  Filled 2015-09-04: qty 3

## 2015-09-04 MED ORDER — TRIAMCINOLONE ACETONIDE 40 MG/ML IJ SUSP
INTRAMUSCULAR | Status: AC
  Filled 2015-09-04: qty 1

## 2015-09-04 MED ORDER — INDOMETHACIN 50 MG PO CAPS: 50.0000 mg | ORAL_CAPSULE | Freq: Two times a day (BID) | ORAL | 0 refills | Status: DC

## 2015-09-04 NOTE — ED Triage Notes (Signed)
The patient presented to the [facility] with a complaint of right wrist pain x 2 weeks. The patient denied any known injury.

## 2015-10-10 ENCOUNTER — Telehealth: Payer: Self-pay | Admitting: Family Medicine

## 2015-10-10 NOTE — Telephone Encounter (Signed)
Pt came to the office to speak with nurse regarding the EKG that was order by Dr. Danelle Earthly back in 03/08/15. Please follow up.   Thank you

## 2015-10-10 NOTE — Telephone Encounter (Signed)
I do not see an EKG from that time but I am not the person who should go over that with her so will forward to Dr. Venetia Night.

## 2015-10-11 NOTE — Telephone Encounter (Signed)
Please schedule for an office visit

## 2015-10-11 NOTE — Telephone Encounter (Signed)
Writer tried to call patient back to schedule an appt with Dr. Venetia Night.  The person that answered the phone informed writer that this was not patient's number.  Another number was provided however no one answered this number.  Writer was unable to LVM.  Writer added this number into patient's demographics.

## 2015-10-21 ENCOUNTER — Ambulatory Visit: Payer: Self-pay

## 2015-10-24 ENCOUNTER — Encounter (HOSPITAL_COMMUNITY): Payer: Self-pay | Admitting: Emergency Medicine

## 2015-10-24 ENCOUNTER — Emergency Department (HOSPITAL_COMMUNITY): Payer: Self-pay

## 2015-10-24 ENCOUNTER — Emergency Department (HOSPITAL_COMMUNITY)
Admission: EM | Admit: 2015-10-24 | Discharge: 2015-10-24 | Disposition: A | Discharge status: Home / Self Care | Payer: Self-pay | Attending: Emergency Medicine | Admitting: Emergency Medicine

## 2015-10-24 DIAGNOSIS — J449 Chronic obstructive pulmonary disease, unspecified: Secondary | ICD-10-CM | POA: Insufficient documentation

## 2015-10-24 DIAGNOSIS — I10 Essential (primary) hypertension: Secondary | ICD-10-CM | POA: Insufficient documentation

## 2015-10-24 DIAGNOSIS — B9789 Other viral agents as the cause of diseases classified elsewhere: Secondary | ICD-10-CM

## 2015-10-24 DIAGNOSIS — J069 Acute upper respiratory infection, unspecified: Secondary | ICD-10-CM | POA: Insufficient documentation

## 2015-10-24 DIAGNOSIS — F17210 Nicotine dependence, cigarettes, uncomplicated: Secondary | ICD-10-CM | POA: Insufficient documentation

## 2015-10-24 LAB — COMPREHENSIVE METABOLIC PANEL
ALBUMIN: 3.7 g/dL (ref 3.5–5.0)
ALT: 17 U/L (ref 14–54)
AST: 50 U/L — AB (ref 15–41)
Alkaline Phosphatase: 115 U/L (ref 38–126)
Anion gap: 9 (ref 5–15)
BUN: 5 mg/dL — AB (ref 6–20)
CHLORIDE: 101 mmol/L (ref 101–111)
CO2: 25 mmol/L (ref 22–32)
CREATININE: 0.85 mg/dL (ref 0.44–1.00)
Calcium: 9.2 mg/dL (ref 8.9–10.3)
GFR calc Af Amer: >60 mL/min (ref >60)
GFR calc non Af Amer: >60 mL/min (ref >60)
Glucose, Bld: 114 mg/dL — ABNORMAL HIGH (ref 65–99)
Potassium: 4.1 mmol/L (ref 3.5–5.1)
SODIUM: 135 mmol/L (ref 135–145)
Total Bilirubin: 1.3 mg/dL — ABNORMAL HIGH (ref 0.3–1.2)
Total Protein: 7.2 g/dL (ref 6.5–8.1)

## 2015-10-24 LAB — CBC WITH DIFFERENTIAL/PLATELET
BASOS ABS: 0 10*3/uL (ref 0.0–0.1)
BASOS PCT: 1 %
EOS ABS: 0.1 10*3/uL (ref 0.0–0.7)
EOS PCT: 1 %
HCT: 42.1 % (ref 36.0–46.0)
Hemoglobin: 14.4 g/dL (ref 12.0–15.0)
LYMPHS PCT: 48 %
Lymphs Abs: 2.3 10*3/uL (ref 0.7–4.0)
MCH: 31.9 pg (ref 26.0–34.0)
MCHC: 34.2 g/dL (ref 30.0–36.0)
MCV: 93.3 fL (ref 78.0–100.0)
Monocytes Absolute: 0.4 10*3/uL (ref 0.1–1.0)
Monocytes Relative: 8 %
Neutro Abs: 2 10*3/uL (ref 1.7–7.7)
Neutrophils Relative %: 42 %
PLATELETS: 171 10*3/uL (ref 150–400)
RBC: 4.51 MIL/uL (ref 3.87–5.11)
RDW: 13.9 % (ref 11.5–15.5)
WBC: 4.7 10*3/uL (ref 4.0–10.5)

## 2015-10-24 MED ORDER — ALBUTEROL SULFATE (2.5 MG/3ML) 0.083% IN NEBU
5.0000 mg | INHALATION_SOLUTION | Freq: Once | RESPIRATORY_TRACT | Status: AC
  Administered 2015-10-24: 5 mg via RESPIRATORY_TRACT

## 2015-10-24 MED ORDER — ALBUTEROL SULFATE (2.5 MG/3ML) 0.083% IN NEBU
2.5000 mg | INHALATION_SOLUTION | Freq: Once | RESPIRATORY_TRACT | Status: AC
  Administered 2015-10-24: 2.5 mg via RESPIRATORY_TRACT
  Filled 2015-10-24: qty 3

## 2015-10-24 MED ORDER — ALBUTEROL SULFATE HFA 108 (90 BASE) MCG/ACT IN AERS: 1.0000 | INHALATION_SPRAY | Freq: Four times a day (QID) | RESPIRATORY_TRACT | 0 refills | Status: DC | PRN

## 2015-10-24 MED ORDER — ALBUTEROL SULFATE (2.5 MG/3ML) 0.083% IN NEBU
INHALATION_SOLUTION | RESPIRATORY_TRACT | Status: AC
  Filled 2015-10-24: qty 6

## 2015-10-24 NOTE — Discharge Instructions (Signed)
Please read and follow all provided instructions.  Your diagnoses today include:  1. Viral URI with cough    You appear to have an upper respiratory infection (URI). An upper respiratory tract infection, or cold, is a viral infection of the air passages leading to the lungs. It should improve gradually after 5-7 days. You may have a lingering cough that lasts for 2- 4 weeks after the infection.  Tests performed today include: Vital signs. See below for your results today.   Medications prescribed:   Take any prescribed medications only as directed. Treatment for your infection is aimed at treating the symptoms. There are no medications, such as antibiotics, that will cure your infection.   Home care instructions:  Follow any educational materials contained in this packet.   Your illness is contagious and can be spread to others, especially during the first 3 or 4 days. It cannot be cured by antibiotics or other medicines. Take basic precautions such as washing your hands often, covering your mouth when you cough or sneeze, and avoiding public places where you could spread your illness to others.   Please continue drinking plenty of fluids.  Use over-the-counter medicines as needed as directed on packaging for symptom relief.  You may also use ibuprofen or tylenol as directed on packaging for pain or fever.  Do not take multiple medicines containing Tylenol or acetaminophen to avoid taking too much of this medication.  Follow-up instructions: Please follow-up with your primary care provider in the next 3 days for further evaluation of your symptoms if you are not feeling better.   Return instructions:  Please return to the Emergency Department if you experience worsening symptoms.  RETURN IMMEDIATELY IF you develop shortness of breath, confusion or altered mental status, a new rash, become dizzy, faint, or poorly responsive, or are unable to be cared for at home. Please return if you have  persistent vomiting and cannot keep down fluids or develop a fever that is not controlled by tylenol or motrin.   Please return if you have any other emergent concerns.  Additional Information:  Your vital signs today were: BP 158/77    Pulse 98    Temp 98.1 F (36.7 C) (Oral)    Resp 18    SpO2 98%  If your blood pressure (BP) was elevated above 135/85 this visit, please have this repeated by your doctor within one month. --------------

## 2015-10-24 NOTE — ED Triage Notes (Signed)
Pt. reports chronic cough with chest congestion worse today with wheezing / rales and crackles . Denies fever or chills.
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
## 2015-10-24 NOTE — ED Notes (Signed)
RN notified of Hypertension

## 2015-10-24 NOTE — ED Notes (Signed)
Pt had to be woken up in the waiting room to be brought back pt is drowsy during assessment but denies taking any medications

## 2015-11-19 ENCOUNTER — Ambulatory Visit: Payer: Self-pay | Attending: Family Medicine | Admitting: Family Medicine

## 2015-11-19 ENCOUNTER — Encounter: Payer: Self-pay | Admitting: Family Medicine

## 2015-11-19 VITALS — BP 113/82 | HR 107 | Temp 98.4°F | Ht 64.0 in | Wt 178.0 lb

## 2015-11-19 DIAGNOSIS — M545 Low back pain: Secondary | ICD-10-CM | POA: Insufficient documentation

## 2015-11-19 DIAGNOSIS — Z79899 Other long term (current) drug therapy: Secondary | ICD-10-CM | POA: Insufficient documentation

## 2015-11-19 DIAGNOSIS — M1990 Unspecified osteoarthritis, unspecified site: Secondary | ICD-10-CM | POA: Insufficient documentation

## 2015-11-19 DIAGNOSIS — K859 Acute pancreatitis without necrosis or infection, unspecified: Secondary | ICD-10-CM | POA: Insufficient documentation

## 2015-11-19 DIAGNOSIS — M25561 Pain in right knee: Secondary | ICD-10-CM | POA: Insufficient documentation

## 2015-11-19 DIAGNOSIS — F1410 Cocaine abuse, uncomplicated: Secondary | ICD-10-CM | POA: Insufficient documentation

## 2015-11-19 DIAGNOSIS — R748 Abnormal levels of other serum enzymes: Secondary | ICD-10-CM | POA: Insufficient documentation

## 2015-11-19 DIAGNOSIS — K7200 Acute and subacute hepatic failure without coma: Secondary | ICD-10-CM | POA: Insufficient documentation

## 2015-11-19 DIAGNOSIS — G9340 Encephalopathy, unspecified: Secondary | ICD-10-CM | POA: Insufficient documentation

## 2015-11-19 DIAGNOSIS — B160 Acute hepatitis B with delta-agent with hepatic coma: Secondary | ICD-10-CM

## 2015-11-19 DIAGNOSIS — E872 Acidosis: Secondary | ICD-10-CM | POA: Insufficient documentation

## 2015-11-19 DIAGNOSIS — N179 Acute kidney failure, unspecified: Secondary | ICD-10-CM | POA: Insufficient documentation

## 2015-11-19 DIAGNOSIS — K219 Gastro-esophageal reflux disease without esophagitis: Secondary | ICD-10-CM | POA: Insufficient documentation

## 2015-11-19 DIAGNOSIS — B169 Acute hepatitis B without delta-agent and without hepatic coma: Secondary | ICD-10-CM | POA: Insufficient documentation

## 2015-11-19 DIAGNOSIS — G8929 Other chronic pain: Secondary | ICD-10-CM

## 2015-11-19 DIAGNOSIS — M25552 Pain in left hip: Secondary | ICD-10-CM | POA: Insufficient documentation

## 2015-11-19 DIAGNOSIS — K7010 Alcoholic hepatitis without ascites: Secondary | ICD-10-CM | POA: Insufficient documentation

## 2015-11-19 DIAGNOSIS — I10 Essential (primary) hypertension: Secondary | ICD-10-CM | POA: Insufficient documentation

## 2015-11-19 DIAGNOSIS — Z86711 Personal history of pulmonary embolism: Secondary | ICD-10-CM | POA: Insufficient documentation

## 2015-11-19 DIAGNOSIS — J45909 Unspecified asthma, uncomplicated: Secondary | ICD-10-CM | POA: Insufficient documentation

## 2015-11-19 DIAGNOSIS — K7201 Acute and subacute hepatic failure with coma: Secondary | ICD-10-CM

## 2015-11-19 DIAGNOSIS — Z9889 Other specified postprocedural states: Secondary | ICD-10-CM | POA: Insufficient documentation

## 2015-11-19 LAB — CBC WITH DIFFERENTIAL/PLATELET
BASOS ABS: 0 {cells}/uL (ref 0–200)
BASOS PCT: 0 %
EOS ABS: 66 {cells}/uL (ref 15–500)
Eosinophils Relative: 1 %
HEMATOCRIT: 43.2 % (ref 35.0–45.0)
Hemoglobin: 14.5 g/dL (ref 11.7–15.5)
LYMPHS PCT: 29 %
Lymphs Abs: 1914 cells/uL (ref 850–3900)
MCH: 32.1 pg (ref 27.0–33.0)
MCHC: 33.6 g/dL (ref 32.0–36.0)
MCV: 95.6 fL (ref 80.0–100.0)
MONO ABS: 528 {cells}/uL (ref 200–950)
MONOS PCT: 8 %
MPV: 10.1 fL (ref 7.5–12.5)
NEUTROS PCT: 62 %
Neutro Abs: 4092 cells/uL (ref 1500–7800)
Platelets: 208 10*3/uL (ref 140–400)
RBC: 4.52 MIL/uL (ref 3.80–5.10)
RDW: 15 % (ref 11.0–15.0)
WBC: 6.6 10*3/uL (ref 3.8–10.8)

## 2015-11-19 LAB — COMPLETE METABOLIC PANEL WITH GFR
ALT: 13 U/L (ref 6–29)
AST: 32 U/L (ref 10–35)
Albumin: 3.9 g/dL (ref 3.6–5.1)
Alkaline Phosphatase: 107 U/L (ref 33–130)
BILIRUBIN TOTAL: 0.9 mg/dL (ref 0.2–1.2)
BUN: 9 mg/dL (ref 7–25)
CHLORIDE: 98 mmol/L (ref 98–110)
CO2: 29 mmol/L (ref 20–31)
CREATININE: 0.71 mg/dL (ref 0.50–1.05)
Calcium: 9.5 mg/dL (ref 8.6–10.4)
GFR, Est African American: >89 mL/min (ref >=60)
GFR, Est Non African American: >89 mL/min (ref >=60)
GLUCOSE: 89 mg/dL (ref 65–99)
Potassium: 4.6 mmol/L (ref 3.5–5.3)
Sodium: 135 mmol/L (ref 135–146)
TOTAL PROTEIN: 7.7 g/dL (ref 6.1–8.1)

## 2015-11-19 MED ORDER — PANTOPRAZOLE SODIUM 40 MG PO TBEC: 40.0000 mg | DELAYED_RELEASE_TABLET | Freq: Every day | ORAL | 3 refills | Status: DC

## 2015-11-19 MED ORDER — PROPRANOLOL HCL 10 MG PO TABS: 10.0000 mg | ORAL_TABLET | Freq: Three times a day (TID) | ORAL | 3 refills | Status: DC

## 2015-11-19 MED ORDER — NAPROXEN 500 MG PO TABS: 500.0000 mg | ORAL_TABLET | Freq: Two times a day (BID) | ORAL | 1 refills | Status: DC

## 2015-11-19 MED ORDER — FUROSEMIDE 20 MG PO TABS: 20.0000 mg | ORAL_TABLET | Freq: Every day | ORAL | 3 refills | Status: DC

## 2015-11-19 MED ORDER — SPIRONOLACTONE 25 MG PO TABS: 25.0000 mg | ORAL_TABLET | Freq: Every day | ORAL | 3 refills | Status: DC

## 2015-11-19 MED ORDER — METHOCARBAMOL 500 MG PO TABS: 500.0000 mg | ORAL_TABLET | Freq: Two times a day (BID) | ORAL | 1 refills | Status: DC | PRN

## 2015-11-19 MED FILL — ?SPIRONOLACTONE 25 MG TABLE: 25 MG | 30 days supply | Qty: 30 | Fill #0

## 2015-11-19 MED FILL — FUROSEMIDE 20 MG TABLET: 20 | 30 days supply | Qty: 30 | Fill #0

## 2015-11-19 MED FILL — METHOCARBAMOL 500 MG TABLET: 500 | 30 days supply | Qty: 60 | Fill #0

## 2015-11-19 MED FILL — PROPRANOLOL 10 MG TABLET: 10 | 30 days supply | Qty: 90 | Fill #0

## 2015-11-19 MED FILL — NAPROXEN 500 MG TABLET: 500 | 30 days supply | Qty: 60 | Fill #0

## 2015-11-19 NOTE — Progress Notes (Signed)
Takes clonidine when she remembers

## 2015-11-20 ENCOUNTER — Other Ambulatory Visit: Payer: Self-pay | Admitting: Family Medicine

## 2015-11-20 DIAGNOSIS — B179 Acute viral hepatitis, unspecified: Secondary | ICD-10-CM

## 2015-11-20 LAB — ACUTE HEP PANEL AND HEP B SURFACE AB
HCV Ab: NEGATIVE
HEP B S AB: NEGATIVE
Hep A IgM: NONREACTIVE
Hep B C IgM: REACTIVE — AB
Hepatitis B Surface Ag: NEGATIVE
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
## 2015-11-22 ENCOUNTER — Telehealth: Payer: Self-pay

## 2015-11-22 LAB — AMMONIA

## 2015-11-22 NOTE — Telephone Encounter (Signed)
[facility] CSR, Tiffany, called stating that the lab work for ammonia test was not received. Order was cancelled per VJW. Dr. Venetia Night notified of cancelled order and request for future order.Patient has been called to try and set up an appointment for lab visit.

## 2015-11-23 ENCOUNTER — Encounter (HOSPITAL_COMMUNITY): Payer: Self-pay | Admitting: *Deleted

## 2015-11-23 ENCOUNTER — Emergency Department (HOSPITAL_COMMUNITY)
Admission: EM | Admit: 2015-11-23 | Discharge: 2015-11-23 | Disposition: A | Discharge status: Home / Self Care | Payer: Self-pay | Attending: Emergency Medicine | Admitting: Emergency Medicine

## 2015-11-23 ENCOUNTER — Emergency Department (HOSPITAL_COMMUNITY): Payer: Self-pay

## 2015-11-23 DIAGNOSIS — J40 Bronchitis, not specified as acute or chronic: Secondary | ICD-10-CM | POA: Insufficient documentation

## 2015-11-23 DIAGNOSIS — Z79899 Other long term (current) drug therapy: Secondary | ICD-10-CM | POA: Insufficient documentation

## 2015-11-23 DIAGNOSIS — I10 Essential (primary) hypertension: Secondary | ICD-10-CM | POA: Insufficient documentation

## 2015-11-23 DIAGNOSIS — J449 Chronic obstructive pulmonary disease, unspecified: Secondary | ICD-10-CM | POA: Insufficient documentation

## 2015-11-23 DIAGNOSIS — F17210 Nicotine dependence, cigarettes, uncomplicated: Secondary | ICD-10-CM | POA: Insufficient documentation

## 2015-11-23 LAB — COMPREHENSIVE METABOLIC PANEL
ALBUMIN: 4 g/dL (ref 3.5–5.0)
ALK PHOS: 84 U/L (ref 38–126)
ALT: 17 U/L (ref 14–54)
AST: 32 U/L (ref 15–41)
Anion gap: 11 (ref 5–15)
BILIRUBIN TOTAL: 0.9 mg/dL (ref 0.3–1.2)
BUN: 13 mg/dL (ref 6–20)
CO2: 26 mmol/L (ref 22–32)
Calcium: 9.4 mg/dL (ref 8.9–10.3)
Chloride: 102 mmol/L (ref 101–111)
Creatinine, Ser: 0.75 mg/dL (ref 0.44–1.00)
GFR calc Af Amer: >60 mL/min (ref >60)
GFR calc non Af Amer: >60 mL/min (ref >60)
GLUCOSE: 84 mg/dL (ref 65–99)
POTASSIUM: 4.1 mmol/L (ref 3.5–5.1)
Sodium: 139 mmol/L (ref 135–145)
TOTAL PROTEIN: 8.3 g/dL — AB (ref 6.5–8.1)

## 2015-11-23 LAB — CBC WITH DIFFERENTIAL/PLATELET
BASOS ABS: 0 10*3/uL (ref 0.0–0.1)
Basophils Relative: 1 %
EOS PCT: 1 %
Eosinophils Absolute: 0.1 10*3/uL (ref 0.0–0.7)
HCT: 41.1 % (ref 36.0–46.0)
HEMOGLOBIN: 14.2 g/dL (ref 12.0–15.0)
LYMPHS PCT: 31 %
Lymphs Abs: 2.6 10*3/uL (ref 0.7–4.0)
MCH: 32.2 pg (ref 26.0–34.0)
MCHC: 34.5 g/dL (ref 30.0–36.0)
MCV: 93.2 fL (ref 78.0–100.0)
Monocytes Absolute: 0.6 10*3/uL (ref 0.1–1.0)
Monocytes Relative: 8 %
NEUTROS PCT: 59 %
Neutro Abs: 5 10*3/uL (ref 1.7–7.7)
PLATELETS: 233 10*3/uL (ref 150–400)
RBC: 4.41 MIL/uL (ref 3.87–5.11)
RDW: 14.3 % (ref 11.5–15.5)
WBC: 8.4 10*3/uL (ref 4.0–10.5)

## 2015-11-23 MED ORDER — SULFAMETHOXAZOLE-TRIMETHOPRIM 800-160 MG PO TABS
1.0000 | ORAL_TABLET | Freq: Once | ORAL | Status: AC
  Administered 2015-11-23: 1 via ORAL
  Filled 2015-11-23: qty 1

## 2015-11-23 MED ORDER — AEROCHAMBER PLUS W/MASK MISC
1.0000 | Freq: Once | Status: DC
  Filled 2015-11-23: qty 1

## 2015-11-23 MED ORDER — SULFAMETHOXAZOLE-TRIMETHOPRIM 800-160 MG PO TABS: 1.0000 | ORAL_TABLET | Freq: Two times a day (BID) | ORAL | 0 refills | Status: AC

## 2015-11-23 MED ORDER — IPRATROPIUM-ALBUTEROL 0.5-2.5 (3) MG/3ML IN SOLN
3.0000 mL | Freq: Four times a day (QID) | RESPIRATORY_TRACT | Status: DC
  Administered 2015-11-23: 3 mL via RESPIRATORY_TRACT
  Filled 2015-11-23: qty 3

## 2015-11-23 MED ORDER — ACETAMINOPHEN 325 MG PO TABS
650.0000 mg | ORAL_TABLET | Freq: Once | ORAL | Status: AC
  Administered 2015-11-23: 650 mg via ORAL
  Filled 2015-11-23: qty 2

## 2015-11-23 NOTE — ED Notes (Signed)
Unable to visualize area for successful IV attempt. Talmadge Coventry charge RN verbalizes en route to attempt US guided IV.

## 2015-11-23 NOTE — ED Notes (Signed)
Patient transported to X-ray. Will draw labs and establish IV when pt returns.

## 2015-11-23 NOTE — ED Triage Notes (Signed)
Patients presents with fiance to ED with c/o SOB since last night, worse today.  Patient has had productive cough and nausea with diarrhea yesterday.  Patient denies fever/chills.  She has used an albuterol inhaler with no relief.  Patient denies abd pain or cramping.    Patient also left hip pain which has persisted since a fall last year and the most recent flare began last Monday.  She was seen at [facility] and prescribed a muscle relaxer with no relief.  Finally, patient c/o right radial wrist pain which has persisted since she received a steroid injection in that wrist in August of this year at [facility].  Patient is supposed to be taking Lasix, but doesn't take it because it makes her urinate too often.  No LE edema noted.  Patient admits to heavy drinking and has had a 40 oz today.  Patient also admits to cocaine use today as well as frequent marijuana use.  EKG done in triage.

## 2015-11-23 NOTE — Discharge Instructions (Signed)
Use your albuterol inhaler 2 puffs every 4 hours as needed for cough or shortness of breath. Return if needed more than every 4 hours. Take Tylenol as needed for aches. Keep your scheduled appointment with Dr. Eulah Pont for today. Ask your primary care physician to help you to stop smoking
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
## 2015-11-23 NOTE — ED Notes (Signed)
MD at bedside. 

## 2015-11-23 NOTE — ED Notes (Signed)
Nurse is in the room attempting to collect labs 

## 2015-11-25 ENCOUNTER — Other Ambulatory Visit: Payer: Self-pay | Admitting: Family Medicine

## 2015-11-25 DIAGNOSIS — K7200 Acute and subacute hepatic failure without coma: Secondary | ICD-10-CM

## 2015-11-25 DIAGNOSIS — B160 Acute hepatitis B with delta-agent with hepatic coma: Secondary | ICD-10-CM

## 2015-11-25 LAB — HEPATITIS B E ANTIBODY: Hepatitis Be Antibody: REACTIVE — AB

## 2015-11-28 ENCOUNTER — Telehealth: Payer: Self-pay

## 2015-11-28 NOTE — Telephone Encounter (Signed)
Writer called patient and LVM requested a return call to discuss lab results.

## 2015-11-28 NOTE — Telephone Encounter (Signed)
-----   Message from Enobong Amao, MD sent at 11/25/2015  1:59 PM EDT ----- Labs reveal she has persistent hepatitis B infection and so I have referred her to infectious disease. 

## 2015-11-28 NOTE — Telephone Encounter (Addendum)
Left message requested return call to [facility].  Attempted to advise patient per Dr. Thad Ranger reveal she has persistent hepatitis B infection and so I have referred her to infectious disease.  Pollyann Kennedy, RN, BSN

## 2015-11-29 ENCOUNTER — Telehealth: Payer: Self-pay

## 2015-11-29 NOTE — Telephone Encounter (Signed)
Writer LVM requesting a return call to discuss labs.

## 2015-11-29 NOTE — Telephone Encounter (Signed)
-----   Message from Jaclyn Shaggy, MD sent at 11/25/2015  1:59 PM EDT ----- Labs reveal she has persistent hepatitis B infection and so I have referred her to infectious disease.

## 2015-12-11 NOTE — Progress Notes (Signed)
Review of chart shows several messages left for patient.  Letter sent  Pollyann Kennedy, RN, BSN

## 2016-01-06 MED FILL — ?PANTOPRAZOLE SOD DR 40MG: 40 MG | 30 days supply | Qty: 30 | Fill #0

## 2016-01-07 ENCOUNTER — Telehealth: Payer: Self-pay | Admitting: Family Medicine

## 2016-01-07 DIAGNOSIS — S025XXB Fracture of tooth (traumatic), initial encounter for open fracture: Secondary | ICD-10-CM

## 2016-01-07 NOTE — Telephone Encounter (Signed)
Patient came to the office to follow up on her referral for the dentist. Pt is in a lot of pain and has lost two teeth. Please follow up.   Thank you.

## 2016-01-09 NOTE — Telephone Encounter (Signed)
The dental office will get in touch with her

## 2016-01-10 NOTE — Telephone Encounter (Signed)
Writer called and LVM that MD placed the referral for the dentist and per Dr. Venetia Night they will be in touch with her to make an appt.

## 2016-02-13 ENCOUNTER — Ambulatory Visit: Payer: Self-pay | Admitting: Infectious Diseases

## 2016-04-01 ENCOUNTER — Other Ambulatory Visit: Payer: Self-pay

## 2016-04-01 ENCOUNTER — Emergency Department (HOSPITAL_COMMUNITY)
Admission: EM | Admit: 2016-04-01 | Discharge: 2016-04-02 | Disposition: A | Discharge status: Home / Self Care | Payer: Self-pay | Attending: Emergency Medicine | Admitting: Emergency Medicine

## 2016-04-01 ENCOUNTER — Encounter (HOSPITAL_COMMUNITY): Payer: Self-pay

## 2016-04-01 ENCOUNTER — Emergency Department (HOSPITAL_COMMUNITY): Payer: Self-pay

## 2016-04-01 DIAGNOSIS — Y929 Unspecified place or not applicable: Secondary | ICD-10-CM | POA: Insufficient documentation

## 2016-04-01 DIAGNOSIS — R0789 Other chest pain: Secondary | ICD-10-CM | POA: Insufficient documentation

## 2016-04-01 DIAGNOSIS — S90852A Superficial foreign body, left foot, initial encounter: Secondary | ICD-10-CM | POA: Insufficient documentation

## 2016-04-01 DIAGNOSIS — Y999 Unspecified external cause status: Secondary | ICD-10-CM | POA: Insufficient documentation

## 2016-04-01 DIAGNOSIS — Y939 Activity, unspecified: Secondary | ICD-10-CM | POA: Insufficient documentation

## 2016-04-01 DIAGNOSIS — J45909 Unspecified asthma, uncomplicated: Secondary | ICD-10-CM | POA: Insufficient documentation

## 2016-04-01 DIAGNOSIS — F17210 Nicotine dependence, cigarettes, uncomplicated: Secondary | ICD-10-CM | POA: Insufficient documentation

## 2016-04-01 DIAGNOSIS — I10 Essential (primary) hypertension: Secondary | ICD-10-CM | POA: Insufficient documentation

## 2016-04-01 DIAGNOSIS — J069 Acute upper respiratory infection, unspecified: Secondary | ICD-10-CM | POA: Insufficient documentation

## 2016-04-01 DIAGNOSIS — Z7982 Long term (current) use of aspirin: Secondary | ICD-10-CM | POA: Insufficient documentation

## 2016-04-01 DIAGNOSIS — W458XXA Other foreign body or object entering through skin, initial encounter: Secondary | ICD-10-CM | POA: Insufficient documentation

## 2016-04-01 LAB — BASIC METABOLIC PANEL
Anion gap: 14 (ref 5–15)
BUN: <5 mg/dL — ABNORMAL LOW (ref 6–20)
CALCIUM: 9.2 mg/dL (ref 8.9–10.3)
CO2: 24 mmol/L (ref 22–32)
Chloride: 100 mmol/L — ABNORMAL LOW (ref 101–111)
Creatinine, Ser: 0.6 mg/dL (ref 0.44–1.00)
GFR calc Af Amer: >60 mL/min (ref >60)
GLUCOSE: 103 mg/dL — AB (ref 65–99)
Potassium: 3.6 mmol/L (ref 3.5–5.1)
SODIUM: 138 mmol/L (ref 135–145)

## 2016-04-01 LAB — CBC
HCT: 36.7 % (ref 36.0–46.0)
Hemoglobin: 12.7 g/dL (ref 12.0–15.0)
MCH: 32.2 pg (ref 26.0–34.0)
MCHC: 34.6 g/dL (ref 30.0–36.0)
MCV: 93.1 fL (ref 78.0–100.0)
Platelets: 191 10*3/uL (ref 150–400)
RBC: 3.94 MIL/uL (ref 3.87–5.11)
RDW: 15.8 % — ABNORMAL HIGH (ref 11.5–15.5)
WBC: 9.7 10*3/uL (ref 4.0–10.5)
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
## 2016-04-02 LAB — I-STAT TROPONIN, ED: TROPONIN I, POC: 0 ng/mL (ref 0.00–0.08)

## 2016-04-02 MED ORDER — PREDNISONE 20 MG PO TABS: 40.0000 mg | ORAL_TABLET | Freq: Every day | ORAL | 0 refills | Status: DC

## 2016-04-02 MED ORDER — BENZONATATE 100 MG PO CAPS: 100.0000 mg | ORAL_CAPSULE | Freq: Three times a day (TID) | ORAL | 0 refills | Status: DC

## 2016-04-02 MED ORDER — TRAMADOL HCL 50 MG PO TABS: 50.0000 mg | ORAL_TABLET | Freq: Four times a day (QID) | ORAL | 0 refills | Status: DC | PRN

## 2016-04-02 MED ORDER — ALBUTEROL SULFATE HFA 108 (90 BASE) MCG/ACT IN AERS: 2.0000 | INHALATION_SPRAY | RESPIRATORY_TRACT | 2 refills | Status: DC | PRN

## 2016-04-02 MED ORDER — OXYCODONE-ACETAMINOPHEN 5-325 MG PO TABS
1.0000 | ORAL_TABLET | Freq: Once | ORAL | Status: AC
  Administered 2016-04-02: 1 via ORAL
  Filled 2016-04-02: qty 1

## 2016-04-02 MED ORDER — IBUPROFEN 800 MG PO TABS
800.0000 mg | ORAL_TABLET | Freq: Once | ORAL | Status: AC
  Administered 2016-04-02: 800 mg via ORAL
  Filled 2016-04-02: qty 1

## 2016-04-02 MED ORDER — LIDOCAINE HCL 2 % IJ SOLN
5.0000 mL | Freq: Once | INTRAMUSCULAR | Status: AC
  Administered 2016-04-02: 100 mg
  Filled 2016-04-02: qty 20

## 2016-04-02 MED ORDER — HYDROCODONE-HOMATROPINE 5-1.5 MG/5ML PO SYRP: 5.0000 mL | ORAL_SOLUTION | Freq: Four times a day (QID) | ORAL | 0 refills | Status: DC | PRN

## 2016-04-02 NOTE — ED Notes (Signed)
Pt ambulated to bathroom assisted by family

## 2016-04-02 NOTE — ED Notes (Signed)
Pt stable, ambulatory, states understanding of discharge instructions 

## 2016-04-03 ENCOUNTER — Emergency Department (HOSPITAL_COMMUNITY): Payer: Self-pay

## 2016-04-03 ENCOUNTER — Encounter (HOSPITAL_COMMUNITY): Payer: Self-pay | Admitting: Emergency Medicine

## 2016-04-03 ENCOUNTER — Observation Stay (HOSPITAL_COMMUNITY)
Admission: EM | Admit: 2016-04-03 | Discharge: 2016-04-04 | Disposition: A | Discharge status: Home / Self Care | Payer: Self-pay | Attending: Internal Medicine | Admitting: Internal Medicine

## 2016-04-03 DIAGNOSIS — K219 Gastro-esophageal reflux disease without esophagitis: Secondary | ICD-10-CM | POA: Insufficient documentation

## 2016-04-03 DIAGNOSIS — Z86711 Personal history of pulmonary embolism: Secondary | ICD-10-CM | POA: Insufficient documentation

## 2016-04-03 DIAGNOSIS — I10 Essential (primary) hypertension: Secondary | ICD-10-CM | POA: Diagnosis present

## 2016-04-03 DIAGNOSIS — R1011 Right upper quadrant pain: Secondary | ICD-10-CM

## 2016-04-03 DIAGNOSIS — J440 Chronic obstructive pulmonary disease with acute lower respiratory infection: Secondary | ICD-10-CM | POA: Insufficient documentation

## 2016-04-03 DIAGNOSIS — I700 Atherosclerosis of aorta: Secondary | ICD-10-CM | POA: Insufficient documentation

## 2016-04-03 DIAGNOSIS — Z720 Tobacco use: Secondary | ICD-10-CM | POA: Diagnosis present

## 2016-04-03 DIAGNOSIS — D509 Iron deficiency anemia, unspecified: Secondary | ICD-10-CM | POA: Insufficient documentation

## 2016-04-03 DIAGNOSIS — Z91199 Patient's noncompliance with other medical treatment and regimen due to unspecified reason: Secondary | ICD-10-CM

## 2016-04-03 DIAGNOSIS — Z7982 Long term (current) use of aspirin: Secondary | ICD-10-CM | POA: Insufficient documentation

## 2016-04-03 DIAGNOSIS — F1010 Alcohol abuse, uncomplicated: Secondary | ICD-10-CM | POA: Diagnosis present

## 2016-04-03 DIAGNOSIS — K8020 Calculus of gallbladder without cholecystitis without obstruction: Secondary | ICD-10-CM | POA: Insufficient documentation

## 2016-04-03 DIAGNOSIS — F1410 Cocaine abuse, uncomplicated: Secondary | ICD-10-CM | POA: Insufficient documentation

## 2016-04-03 DIAGNOSIS — B160 Acute hepatitis B with delta-agent with hepatic coma: Secondary | ICD-10-CM

## 2016-04-03 DIAGNOSIS — Z9119 Patient's noncompliance with other medical treatment and regimen: Secondary | ICD-10-CM | POA: Insufficient documentation

## 2016-04-03 DIAGNOSIS — J180 Bronchopneumonia, unspecified organism: Principal | ICD-10-CM | POA: Insufficient documentation

## 2016-04-03 DIAGNOSIS — R000 Tachycardia, unspecified: Secondary | ICD-10-CM | POA: Insufficient documentation

## 2016-04-03 DIAGNOSIS — B1910 Unspecified viral hepatitis B without hepatic coma: Secondary | ICD-10-CM | POA: Insufficient documentation

## 2016-04-03 DIAGNOSIS — J449 Chronic obstructive pulmonary disease, unspecified: Secondary | ICD-10-CM | POA: Diagnosis present

## 2016-04-03 DIAGNOSIS — I5032 Chronic diastolic (congestive) heart failure: Secondary | ICD-10-CM | POA: Diagnosis present

## 2016-04-03 DIAGNOSIS — I6523 Occlusion and stenosis of bilateral carotid arteries: Secondary | ICD-10-CM | POA: Insufficient documentation

## 2016-04-03 DIAGNOSIS — K7201 Acute and subacute hepatic failure with coma: Secondary | ICD-10-CM

## 2016-04-03 DIAGNOSIS — K760 Fatty (change of) liver, not elsewhere classified: Secondary | ICD-10-CM | POA: Insufficient documentation

## 2016-04-03 DIAGNOSIS — F17210 Nicotine dependence, cigarettes, uncomplicated: Secondary | ICD-10-CM | POA: Insufficient documentation

## 2016-04-03 DIAGNOSIS — J189 Pneumonia, unspecified organism: Secondary | ICD-10-CM | POA: Diagnosis present

## 2016-04-03 DIAGNOSIS — I110 Hypertensive heart disease with heart failure: Secondary | ICD-10-CM | POA: Insufficient documentation

## 2016-04-03 DIAGNOSIS — Z9114 Patient's other noncompliance with medication regimen: Secondary | ICD-10-CM | POA: Insufficient documentation

## 2016-04-03 DIAGNOSIS — K7030 Alcoholic cirrhosis of liver without ascites: Secondary | ICD-10-CM | POA: Insufficient documentation

## 2016-04-03 DIAGNOSIS — F1020 Alcohol dependence, uncomplicated: Secondary | ICD-10-CM | POA: Insufficient documentation

## 2016-04-03 DIAGNOSIS — K7041 Alcoholic hepatic failure with coma: Secondary | ICD-10-CM | POA: Insufficient documentation

## 2016-04-03 LAB — RAPID URINE DRUG SCREEN, HOSP PERFORMED
AMPHETAMINES: NOT DETECTED
BARBITURATES: NOT DETECTED
BENZODIAZEPINES: NOT DETECTED
Cocaine: POSITIVE — AB
Opiates: NOT DETECTED
Tetrahydrocannabinol: NOT DETECTED

## 2016-04-03 LAB — BLOOD GAS, ARTERIAL
Acid-Base Excess: 1.1 mmol/L (ref 0.0–2.0)
Bicarbonate: 23.2 mmol/L (ref 20.0–28.0)
FIO2: 0.21
O2 SAT: 98.3 %
PCO2 ART: 29.3 mmHg — AB (ref 32.0–48.0)
PO2 ART: 107 mmHg (ref 83.0–108.0)
Patient temperature: 98.6
pH, Arterial: 7.511 — ABNORMAL HIGH (ref 7.350–7.450)

## 2016-04-03 LAB — URINALYSIS, ROUTINE W REFLEX MICROSCOPIC
BILIRUBIN URINE: NEGATIVE
GLUCOSE, UA: NEGATIVE mg/dL
KETONES UR: NEGATIVE mg/dL
LEUKOCYTES UA: NEGATIVE
Nitrite: NEGATIVE
PH: 6 (ref 5.0–8.0)
Protein, ur: NEGATIVE mg/dL
Specific Gravity, Urine: 1.021 (ref 1.005–1.030)

## 2016-04-03 LAB — CBC WITH DIFFERENTIAL/PLATELET
BASOS ABS: 0 10*3/uL (ref 0.0–0.1)
BASOS PCT: 0 %
EOS PCT: 0 %
Eosinophils Absolute: 0 10*3/uL (ref 0.0–0.7)
HCT: 38 % (ref 36.0–46.0)
Hemoglobin: 13.4 g/dL (ref 12.0–15.0)
Lymphocytes Relative: 18 %
Lymphs Abs: 1.9 10*3/uL (ref 0.7–4.0)
MCH: 32.8 pg (ref 26.0–34.0)
MCHC: 35.3 g/dL (ref 30.0–36.0)
MCV: 93.1 fL (ref 78.0–100.0)
MONO ABS: 0.7 10*3/uL (ref 0.1–1.0)
Monocytes Relative: 6 %
NEUTROS ABS: 8.2 10*3/uL — AB (ref 1.7–7.7)
Neutrophils Relative %: 76 %
PLATELETS: 169 10*3/uL (ref 150–400)
RBC: 4.08 MIL/uL (ref 3.87–5.11)
RDW: 15.9 % — AB (ref 11.5–15.5)
WBC: 10.8 10*3/uL — ABNORMAL HIGH (ref 4.0–10.5)

## 2016-04-03 LAB — COMPREHENSIVE METABOLIC PANEL
ALT: 16 U/L (ref 14–54)
AST: 28 U/L (ref 15–41)
Albumin: 3.7 g/dL (ref 3.5–5.0)
Alkaline Phosphatase: 90 U/L (ref 38–126)
Anion gap: 10 (ref 5–15)
CHLORIDE: 100 mmol/L — AB (ref 101–111)
CO2: 27 mmol/L (ref 22–32)
Calcium: 9.2 mg/dL (ref 8.9–10.3)
Creatinine, Ser: 0.54 mg/dL (ref 0.44–1.00)
GFR calc Af Amer: >60 mL/min (ref >60)
GFR calc non Af Amer: >60 mL/min (ref >60)
GLUCOSE: 111 mg/dL — AB (ref 65–99)
POTASSIUM: 3.8 mmol/L (ref 3.5–5.1)
Sodium: 137 mmol/L (ref 135–145)
Total Bilirubin: 2.9 mg/dL — ABNORMAL HIGH (ref 0.3–1.2)
Total Protein: 7.8 g/dL (ref 6.5–8.1)

## 2016-04-03 LAB — LIPASE, BLOOD: Lipase: 21 U/L (ref 11–51)

## 2016-04-03 LAB — STREP PNEUMONIAE URINARY ANTIGEN: STREP PNEUMO URINARY ANTIGEN: NEGATIVE

## 2016-04-03 LAB — PROTIME-INR
INR: 0.99
PROTHROMBIN TIME: 13.1 s (ref 11.4–15.2)

## 2016-04-03 LAB — AMMONIA: AMMONIA: 45 umol/L — AB (ref 9–35)

## 2016-04-03 LAB — I-STAT TROPONIN, ED: Troponin i, poc: 0 ng/mL (ref 0.00–0.08)

## 2016-04-03 LAB — I-STAT CG4 LACTIC ACID, ED: Lactic Acid, Venous: 0.8 mmol/L (ref 0.5–1.9)

## 2016-04-03 MED ORDER — METHYLPREDNISOLONE SODIUM SUCC 125 MG IJ SOLR
125.0000 mg | Freq: Once | INTRAMUSCULAR | Status: AC
  Administered 2016-04-03: 125 mg via INTRAVENOUS
  Filled 2016-04-03: qty 2

## 2016-04-03 MED ORDER — SODIUM CHLORIDE 0.9 % IJ SOLN
INTRAMUSCULAR | Status: AC
  Filled 2016-04-03: qty 50

## 2016-04-03 MED ORDER — FOLIC ACID 1 MG PO TABS
1.0000 mg | ORAL_TABLET | Freq: Every day | ORAL | Status: DC
  Administered 2016-04-03 – 2016-04-04 (×2): 1 mg via ORAL
  Filled 2016-04-03 (×2): qty 1

## 2016-04-03 MED ORDER — ONDANSETRON HCL 4 MG PO TABS: 4.0000 mg | ORAL_TABLET | Freq: Four times a day (QID) | ORAL | Status: DC | PRN

## 2016-04-03 MED ORDER — HYDRALAZINE HCL 25 MG PO TABS: 25.0000 mg | ORAL_TABLET | Freq: Four times a day (QID) | ORAL | Status: DC | PRN

## 2016-04-03 MED ORDER — ASPIRIN EC 81 MG PO TBEC
81.0000 mg | DELAYED_RELEASE_TABLET | Freq: Every day | ORAL | Status: DC
  Administered 2016-04-03 – 2016-04-04 (×2): 81 mg via ORAL
  Filled 2016-04-03 (×2): qty 1

## 2016-04-03 MED ORDER — SODIUM CHLORIDE 0.9% FLUSH
3.0000 mL | INTRAVENOUS | Status: DC | PRN
Start: 2016-04-03 — End: 2016-04-04

## 2016-04-03 MED ORDER — FUROSEMIDE 20 MG PO TABS
20.0000 mg | ORAL_TABLET | Freq: Every day | ORAL | Status: DC
Start: 2016-04-03 — End: 2016-04-04
  Administered 2016-04-03 – 2016-04-04 (×2): 20 mg via ORAL
  Filled 2016-04-03 (×2): qty 1

## 2016-04-03 MED ORDER — SODIUM CHLORIDE 0.9 % IV BOLUS (SEPSIS)
1000.0000 mL | Freq: Once | INTRAVENOUS | Status: AC
  Administered 2016-04-03: 1000 mL via INTRAVENOUS

## 2016-04-03 MED ORDER — ALBUTEROL SULFATE HFA 108 (90 BASE) MCG/ACT IN AERS: 2.0000 | INHALATION_SPRAY | RESPIRATORY_TRACT | Status: DC | PRN

## 2016-04-03 MED ORDER — THIAMINE HCL 100 MG/ML IJ SOLN: 100.0000 mg | Freq: Every day | INTRAMUSCULAR | Status: DC

## 2016-04-03 MED ORDER — ALBUTEROL SULFATE (2.5 MG/3ML) 0.083% IN NEBU
5.0000 mg | INHALATION_SOLUTION | Freq: Once | RESPIRATORY_TRACT | Status: AC
  Administered 2016-04-03: 5 mg via RESPIRATORY_TRACT
  Filled 2016-04-03: qty 6

## 2016-04-03 MED ORDER — ACETAMINOPHEN 325 MG PO TABS
650.0000 mg | ORAL_TABLET | Freq: Four times a day (QID) | ORAL | Status: DC | PRN
  Administered 2016-04-03 (×2): 650 mg via ORAL
  Filled 2016-04-03 (×2): qty 2

## 2016-04-03 MED ORDER — DEXTROSE 5 % IV SOLN
500.0000 mg | INTRAVENOUS | Status: DC
  Administered 2016-04-04: 500 mg via INTRAVENOUS
  Filled 2016-04-03: qty 500

## 2016-04-03 MED ORDER — DEXTROSE 5 % IV SOLN
1.0000 g | Freq: Once | INTRAVENOUS | Status: AC
  Administered 2016-04-03: 1 g via INTRAVENOUS
  Filled 2016-04-03: qty 10

## 2016-04-03 MED ORDER — ONDANSETRON HCL 4 MG/2ML IJ SOLN: 4.0000 mg | Freq: Four times a day (QID) | INTRAMUSCULAR | Status: DC | PRN

## 2016-04-03 MED ORDER — SODIUM CHLORIDE 0.9% FLUSH
3.0000 mL | Freq: Two times a day (BID) | INTRAVENOUS | Status: DC
  Administered 2016-04-03 – 2016-04-04 (×3): 3 mL via INTRAVENOUS

## 2016-04-03 MED ORDER — POLYETHYLENE GLYCOL 3350 17 G PO PACK: 17.0000 g | PACK | Freq: Every day | ORAL | Status: DC | PRN

## 2016-04-03 MED ORDER — LACTULOSE 10 GM/15ML PO SOLN
10.0000 g | Freq: Once | ORAL | Status: AC
  Administered 2016-04-03: 10 g via ORAL
  Filled 2016-04-03: qty 15

## 2016-04-03 MED ORDER — ADULT MULTIVITAMIN W/MINERALS CH
1.0000 | ORAL_TABLET | Freq: Every day | ORAL | Status: DC
  Administered 2016-04-03 – 2016-04-04 (×2): 1 via ORAL
  Filled 2016-04-03 (×2): qty 1

## 2016-04-03 MED ORDER — ACETAMINOPHEN 650 MG RE SUPP: 650.0000 mg | Freq: Four times a day (QID) | RECTAL | Status: DC | PRN

## 2016-04-03 MED ORDER — IOPAMIDOL (ISOVUE-370) INJECTION 76%
INTRAVENOUS | Status: AC
  Administered 2016-04-03: 100 mL
  Filled 2016-04-03: qty 100

## 2016-04-03 MED ORDER — LORAZEPAM 1 MG PO TABS
1.0000 mg | ORAL_TABLET | Freq: Four times a day (QID) | ORAL | Status: DC | PRN
Start: 2016-04-03 — End: 2016-04-04

## 2016-04-03 MED ORDER — DEXTROSE 5 % IV SOLN
500.0000 mg | Freq: Once | INTRAVENOUS | Status: AC
  Administered 2016-04-03: 500 mg via INTRAVENOUS
  Filled 2016-04-03: qty 500

## 2016-04-03 MED ORDER — DEXTROSE 5 % IV SOLN
1.0000 g | INTRAVENOUS | Status: DC
  Administered 2016-04-04: 1 g via INTRAVENOUS
  Filled 2016-04-03: qty 10

## 2016-04-03 MED ORDER — PROPRANOLOL HCL 10 MG PO TABS
10.0000 mg | ORAL_TABLET | Freq: Three times a day (TID) | ORAL | Status: DC
  Administered 2016-04-03 – 2016-04-04 (×3): 10 mg via ORAL
  Filled 2016-04-03 (×3): qty 1

## 2016-04-03 MED ORDER — NICOTINE 14 MG/24HR TD PT24
14.0000 mg | MEDICATED_PATCH | Freq: Every day | TRANSDERMAL | Status: DC
  Administered 2016-04-03 – 2016-04-04 (×2): 14 mg via TRANSDERMAL
  Filled 2016-04-03 (×2): qty 1

## 2016-04-03 MED ORDER — SODIUM CHLORIDE 0.9 % IV SOLN: 250.0000 mL | INTRAVENOUS | Status: DC | PRN

## 2016-04-03 MED ORDER — METOCLOPRAMIDE HCL 5 MG/ML IJ SOLN
10.0000 mg | Freq: Once | INTRAMUSCULAR | Status: AC
  Administered 2016-04-03: 10 mg via INTRAVENOUS
  Filled 2016-04-03: qty 2

## 2016-04-03 MED ORDER — SPIRONOLACTONE 25 MG PO TABS
25.0000 mg | ORAL_TABLET | Freq: Every day | ORAL | Status: DC
  Administered 2016-04-03 – 2016-04-04 (×2): 25 mg via ORAL
  Filled 2016-04-03 (×2): qty 1

## 2016-04-03 MED ORDER — VITAMIN B-1 100 MG PO TABS
100.0000 mg | ORAL_TABLET | Freq: Every day | ORAL | Status: DC
  Administered 2016-04-03 – 2016-04-04 (×2): 100 mg via ORAL
  Filled 2016-04-03 (×2): qty 1

## 2016-04-03 MED ORDER — LORAZEPAM 2 MG/ML IJ SOLN: 1.0000 mg | Freq: Four times a day (QID) | INTRAMUSCULAR | Status: DC | PRN

## 2016-04-03 MED ORDER — ALBUTEROL SULFATE (2.5 MG/3ML) 0.083% IN NEBU
2.5000 mg | INHALATION_SOLUTION | RESPIRATORY_TRACT | Status: DC | PRN
  Administered 2016-04-04: 2.5 mg via RESPIRATORY_TRACT
  Filled 2016-04-03: qty 3

## 2016-04-03 MED ORDER — PANTOPRAZOLE SODIUM 40 MG PO TBEC
40.0000 mg | DELAYED_RELEASE_TABLET | Freq: Every day | ORAL | Status: DC
  Administered 2016-04-03: 40 mg via ORAL
  Filled 2016-04-03: qty 1

## 2016-04-03 MED ORDER — DIPHENHYDRAMINE HCL 50 MG/ML IJ SOLN
25.0000 mg | Freq: Once | INTRAMUSCULAR | Status: AC
  Administered 2016-04-03: 25 mg via INTRAVENOUS
  Filled 2016-04-03: qty 1
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
## 2016-04-03 NOTE — ED Notes (Signed)
Patient transported to CT 

## 2016-04-03 NOTE — ED Notes (Signed)
Patient transported to X-ray 

## 2016-04-03 NOTE — ED Notes (Signed)
Pt complaint of continued "knots" in abdomen and cough with associated SOB; seen 2/21 for same.

## 2016-04-03 NOTE — ED Notes (Signed)
Ambulated pt in hallway. Pulse ox 94% HR 117. Pt complaint of headache.
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
## 2016-04-03 NOTE — ED Notes (Signed)
Pt reports breathing tx helped, however SPO2 on RA remains about 90%. Placed pt on 2L [place]

## 2016-04-03 NOTE — ED Notes (Signed)
US at bedside

## 2016-04-04 DIAGNOSIS — K760 Fatty (change of) liver, not elsewhere classified: Secondary | ICD-10-CM

## 2016-04-04 DIAGNOSIS — J438 Other emphysema: Secondary | ICD-10-CM

## 2016-04-04 DIAGNOSIS — I10 Essential (primary) hypertension: Secondary | ICD-10-CM

## 2016-04-04 DIAGNOSIS — F1010 Alcohol abuse, uncomplicated: Secondary | ICD-10-CM

## 2016-04-04 DIAGNOSIS — I5032 Chronic diastolic (congestive) heart failure: Secondary | ICD-10-CM

## 2016-04-04 DIAGNOSIS — Z9119 Patient's noncompliance with other medical treatment and regimen: Secondary | ICD-10-CM

## 2016-04-04 DIAGNOSIS — K7030 Alcoholic cirrhosis of liver without ascites: Secondary | ICD-10-CM

## 2016-04-04 DIAGNOSIS — Z720 Tobacco use: Secondary | ICD-10-CM

## 2016-04-04 DIAGNOSIS — J189 Pneumonia, unspecified organism: Secondary | ICD-10-CM

## 2016-04-04 DIAGNOSIS — F1410 Cocaine abuse, uncomplicated: Secondary | ICD-10-CM

## 2016-04-04 LAB — CBC
HCT: 34.3 % — ABNORMAL LOW (ref 36.0–46.0)
HEMOGLOBIN: 11.7 g/dL — AB (ref 12.0–15.0)
MCH: 32.1 pg (ref 26.0–34.0)
MCHC: 34.1 g/dL (ref 30.0–36.0)
MCV: 94.2 fL (ref 78.0–100.0)
Platelets: 187 10*3/uL (ref 150–400)
RBC: 3.64 MIL/uL — AB (ref 3.87–5.11)
RDW: 15.6 % — ABNORMAL HIGH (ref 11.5–15.5)
WBC: 9.7 10*3/uL (ref 4.0–10.5)

## 2016-04-04 LAB — COMPREHENSIVE METABOLIC PANEL
ALT: 13 U/L — ABNORMAL LOW (ref 14–54)
ANION GAP: 9 (ref 5–15)
AST: 27 U/L (ref 15–41)
Albumin: 3 g/dL — ABNORMAL LOW (ref 3.5–5.0)
Alkaline Phosphatase: 79 U/L (ref 38–126)
BUN: 5 mg/dL — ABNORMAL LOW (ref 6–20)
CALCIUM: 8.8 mg/dL — AB (ref 8.9–10.3)
CHLORIDE: 102 mmol/L (ref 101–111)
CO2: 27 mmol/L (ref 22–32)
Creatinine, Ser: 0.54 mg/dL (ref 0.44–1.00)
GFR calc non Af Amer: >60 mL/min (ref >60)
Glucose, Bld: 123 mg/dL — ABNORMAL HIGH (ref 65–99)
Potassium: 3.4 mmol/L — ABNORMAL LOW (ref 3.5–5.1)
SODIUM: 138 mmol/L (ref 135–145)
Total Bilirubin: 1.4 mg/dL — ABNORMAL HIGH (ref 0.3–1.2)
Total Protein: 6.8 g/dL (ref 6.5–8.1)

## 2016-04-04 LAB — HIV ANTIBODY (ROUTINE TESTING W REFLEX): HIV SCREEN 4TH GENERATION: NONREACTIVE

## 2016-04-04 MED ORDER — IPRATROPIUM-ALBUTEROL 0.5-2.5 (3) MG/3ML IN SOLN
3.0000 mL | Freq: Three times a day (TID) | RESPIRATORY_TRACT | Status: DC
  Administered 2016-04-04: 3 mL via RESPIRATORY_TRACT
  Filled 2016-04-04: qty 3

## 2016-04-04 MED ORDER — FUROSEMIDE 20 MG PO TABS: 20.0000 mg | ORAL_TABLET | Freq: Every day | ORAL | 3 refills | Status: DC

## 2016-04-04 MED ORDER — ADULT MULTIVITAMIN W/MINERALS CH: 1.0000 | ORAL_TABLET | Freq: Every day | ORAL | 1 refills | Status: AC

## 2016-04-04 MED ORDER — BENZONATATE 100 MG PO CAPS: 200.0000 mg | ORAL_CAPSULE | Freq: Three times a day (TID) | ORAL | 0 refills | Status: DC | PRN

## 2016-04-04 MED ORDER — NICOTINE 14 MG/24HR TD PT24: 14.0000 mg | MEDICATED_PATCH | Freq: Every day | TRANSDERMAL | 0 refills | Status: AC

## 2016-04-04 MED ORDER — PANTOPRAZOLE SODIUM 40 MG PO TBEC: 40.0000 mg | DELAYED_RELEASE_TABLET | Freq: Every day | ORAL | 3 refills | Status: DC

## 2016-04-04 MED ORDER — PROPRANOLOL HCL 20 MG PO TABS: 20.0000 mg | ORAL_TABLET | Freq: Two times a day (BID) | ORAL | 1 refills | Status: DC

## 2016-04-04 MED ORDER — AMOXICILLIN-POT CLAVULANATE 875-125 MG PO TABS: 1.0000 | ORAL_TABLET | Freq: Two times a day (BID) | ORAL | 0 refills | Status: DC

## 2016-04-04 MED ORDER — GUAIFENESIN-DM 100-10 MG/5ML PO SYRP
5.0000 mL | ORAL_SOLUTION | Freq: Four times a day (QID) | ORAL | Status: DC | PRN
  Administered 2016-04-04: 5 mL via ORAL
  Filled 2016-04-04: qty 10

## 2016-04-04 MED ORDER — SPIRONOLACTONE 25 MG PO TABS: 25.0000 mg | ORAL_TABLET | Freq: Every day | ORAL | 2 refills | Status: DC
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
## 2016-04-08 LAB — CULTURE, BLOOD (ROUTINE X 2)
CULTURE: NO GROWTH
Culture: NO GROWTH

## 2016-06-22 MED FILL — AMOX-CLAV 875-125 MG TABLET: 875-125 | 7 days supply | Qty: 14 | Fill #0

## 2016-06-23 ENCOUNTER — Ambulatory Visit: Payer: Self-pay | Attending: Family Medicine | Admitting: Family Medicine

## 2016-06-23 ENCOUNTER — Encounter: Payer: Self-pay | Admitting: Family Medicine

## 2016-06-23 VITALS — BP 146/87 | HR 100 | Temp 97.7°F | Resp 18 | Ht 64.0 in | Wt 179.0 lb

## 2016-06-23 DIAGNOSIS — M5430 Sciatica, unspecified side: Secondary | ICD-10-CM | POA: Insufficient documentation

## 2016-06-23 DIAGNOSIS — M25531 Pain in right wrist: Secondary | ICD-10-CM | POA: Insufficient documentation

## 2016-06-23 DIAGNOSIS — Z9114 Patient's other noncompliance with medication regimen: Secondary | ICD-10-CM | POA: Insufficient documentation

## 2016-06-23 DIAGNOSIS — J45909 Unspecified asthma, uncomplicated: Secondary | ICD-10-CM | POA: Insufficient documentation

## 2016-06-23 DIAGNOSIS — I10 Essential (primary) hypertension: Secondary | ICD-10-CM | POA: Insufficient documentation

## 2016-06-23 DIAGNOSIS — Z79899 Other long term (current) drug therapy: Secondary | ICD-10-CM | POA: Insufficient documentation

## 2016-06-23 DIAGNOSIS — B161 Acute hepatitis B with delta-agent without hepatic coma: Secondary | ICD-10-CM | POA: Insufficient documentation

## 2016-06-23 DIAGNOSIS — Z7982 Long term (current) use of aspirin: Secondary | ICD-10-CM | POA: Insufficient documentation

## 2016-06-23 DIAGNOSIS — M5432 Sciatica, left side: Secondary | ICD-10-CM | POA: Insufficient documentation

## 2016-06-23 DIAGNOSIS — Z13220 Encounter for screening for lipoid disorders: Secondary | ICD-10-CM | POA: Insufficient documentation

## 2016-06-23 DIAGNOSIS — M25552 Pain in left hip: Secondary | ICD-10-CM | POA: Insufficient documentation

## 2016-06-23 DIAGNOSIS — M79671 Pain in right foot: Secondary | ICD-10-CM | POA: Insufficient documentation

## 2016-06-23 DIAGNOSIS — K7030 Alcoholic cirrhosis of liver without ascites: Secondary | ICD-10-CM | POA: Insufficient documentation

## 2016-06-23 MED ORDER — TRAMADOL HCL 50 MG PO TABS: 50.0000 mg | ORAL_TABLET | Freq: Two times a day (BID) | ORAL | 0 refills | Status: DC | PRN

## 2016-06-23 MED ORDER — SPIRONOLACTONE 25 MG PO TABS: 25.0000 mg | ORAL_TABLET | Freq: Every day | ORAL | 3 refills | Status: DC

## 2016-06-23 MED ORDER — KETOROLAC TROMETHAMINE 60 MG/2ML IM SOLN
60.0000 mg | Freq: Once | INTRAMUSCULAR | Status: AC
  Administered 2016-06-23: 60 mg via INTRAMUSCULAR

## 2016-06-23 MED ORDER — PREDNISONE 20 MG PO TABS: 20.0000 mg | ORAL_TABLET | Freq: Two times a day (BID) | ORAL | 0 refills | Status: DC

## 2016-06-23 MED ORDER — PANTOPRAZOLE SODIUM 40 MG PO TBEC: 40.0000 mg | DELAYED_RELEASE_TABLET | Freq: Every day | ORAL | 3 refills | Status: DC

## 2016-06-23 MED ORDER — PROPRANOLOL HCL 20 MG PO TABS: 20.0000 mg | ORAL_TABLET | Freq: Every day | ORAL | 3 refills | Status: DC

## 2016-06-23 MED ORDER — TIZANIDINE HCL 4 MG PO TABS: 4.0000 mg | ORAL_TABLET | Freq: Three times a day (TID) | ORAL | 0 refills | Status: DC | PRN

## 2016-06-23 MED ORDER — ALBUTEROL SULFATE HFA 108 (90 BASE) MCG/ACT IN AERS: 2.0000 | INHALATION_SPRAY | RESPIRATORY_TRACT | 2 refills | Status: DC | PRN

## 2016-06-23 MED ORDER — FUROSEMIDE 20 MG PO TABS: 20.0000 mg | ORAL_TABLET | Freq: Every day | ORAL | 3 refills | Status: DC

## 2016-06-23 MED FILL — !VENTOLIN HFA INHALER: 108 (90 BAS | 16 days supply | Qty: 18 | Fill #0

## 2016-06-23 MED FILL — tiZANidine HCL 4 MG TABS: 4 | 30 days supply | Qty: 90 | Fill #0

## 2016-06-23 MED FILL — predniSONE 20 MG TABS: 20 | 5 days supply | Qty: 10 | Fill #0

## 2016-06-23 MED FILL — traMADol HCL 50 MG TABS: 50 | 20 days supply | Qty: 40 | Fill #0

## 2016-06-23 NOTE — Progress Notes (Signed)
Patient is here for Pain  Patient complains of right wrist and foot pain being present. Hip pain is also present today.  Patient has not taken medication today. Patient request refills on medications.  Patient has not eaten today.  Patient tolerated injection well today.
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
## 2016-06-24 ENCOUNTER — Ambulatory Visit: Payer: Self-pay | Admitting: Family Medicine

## 2016-06-24 ENCOUNTER — Ambulatory Visit: Payer: Self-pay

## 2016-06-25 LAB — CBC WITH DIFFERENTIAL/PLATELET
BASOS: 1 %
Basophils Absolute: 0 10*3/uL (ref 0.0–0.2)
EOS (ABSOLUTE): 0.1 10*3/uL (ref 0.0–0.4)
EOS: 2 %
HEMATOCRIT: 44.2 % (ref 34.0–46.6)
HEMOGLOBIN: 14.1 g/dL (ref 11.1–15.9)
IMMATURE GRANULOCYTES: 0 %
Immature Grans (Abs): 0 10*3/uL (ref 0.0–0.1)
Lymphocytes Absolute: 1.7 10*3/uL (ref 0.7–3.1)
Lymphs: 45 %
MCH: 31.8 pg (ref 26.6–33.0)
MCHC: 31.9 g/dL (ref 31.5–35.7)
MCV: 100 fL — AB (ref 79–97)
MONOS ABS: 0.3 10*3/uL (ref 0.1–0.9)
Monocytes: 8 %
Neutrophils Absolute: 1.7 10*3/uL (ref 1.4–7.0)
Neutrophils: 44 %
Platelets: 197 10*3/uL (ref 150–379)
RBC: 4.44 x10E6/uL (ref 3.77–5.28)
RDW: 16.6 % — AB (ref 12.3–15.4)
WBC: 3.8 10*3/uL (ref 3.4–10.8)

## 2016-06-25 LAB — LIPID PANEL
CHOLESTEROL TOTAL: 256 mg/dL — AB (ref 100–199)
Chol/HDL Ratio: 1.4 ratio (ref 0.0–4.4)
HDL: 178 mg/dL (ref >39)
LDL CALC: 61 mg/dL (ref 0–99)
Triglycerides: 83 mg/dL (ref 0–149)
VLDL CHOLESTEROL CAL: 17 mg/dL (ref 5–40)

## 2016-06-25 LAB — HEPATITIS B E ANTIGEN: HEP B E AG: NEGATIVE

## 2016-06-25 LAB — CMP14+EGFR
ALBUMIN: 4.5 g/dL (ref 3.5–5.5)
ALT: 23 IU/L (ref 0–32)
AST: 69 IU/L — ABNORMAL HIGH (ref 0–40)
Albumin/Globulin Ratio: 1.4 (ref 1.2–2.2)
Alkaline Phosphatase: 101 IU/L (ref 39–117)
BUN / CREAT RATIO: 7 — AB (ref 9–23)
BUN: 5 mg/dL — AB (ref 6–24)
Bilirubin Total: 2 mg/dL — ABNORMAL HIGH (ref 0.0–1.2)
CALCIUM: 9.3 mg/dL (ref 8.7–10.2)
CO2: 21 mmol/L (ref 18–29)
CREATININE: 0.67 mg/dL (ref 0.57–1.00)
Chloride: 99 mmol/L (ref 96–106)
GFR calc non Af Amer: 99 mL/min/{1.73_m2} (ref >59)
GFR, EST AFRICAN AMERICAN: 114 mL/min/{1.73_m2} (ref >59)
GLUCOSE: 77 mg/dL (ref 65–99)
Globulin, Total: 3.2 g/dL (ref 1.5–4.5)
Potassium: 4.3 mmol/L (ref 3.5–5.2)
Sodium: 140 mmol/L (ref 134–144)
TOTAL PROTEIN: 7.7 g/dL (ref 6.0–8.5)

## 2016-06-25 LAB — HEPATITIS B CORE AB W/REFLEX: Hep B Core Total Ab: POSITIVE — AB

## 2016-06-25 LAB — HBCIGM: HEP B C IGM: POSITIVE — AB

## 2016-06-25 LAB — HEPATITIS B SURFACE ANTIBODY, QUANTITATIVE: Hepatitis B Surf Ab Quant: <3.1 m[IU]/mL — ABNORMAL LOW (ref >9.9)

## 2016-06-25 LAB — HEPATITIS B E ANTIBODY: HEP B E AB: POSITIVE — AB

## 2016-06-26 ENCOUNTER — Other Ambulatory Visit: Payer: Self-pay | Admitting: Family Medicine

## 2016-06-26 DIAGNOSIS — B181 Chronic viral hepatitis B without delta-agent: Secondary | ICD-10-CM

## 2016-07-02 MED FILL — PANTOPRAZOLE SOD DR 40 MG T: 40 | 30 days supply | Qty: 30 | Fill #0

## 2016-07-02 MED FILL — BENZONATATE 100 MG CAPSULE: 100 | 4 days supply | Qty: 25 | Fill #0

## 2016-07-02 MED FILL — FUROSEMIDE 20 MG TABLET: 20 | 30 days supply | Qty: 30 | Fill #0

## 2016-07-02 MED FILL — PROPRANOLOL 20 MG TABLET: 20 | 30 days supply | Qty: 60 | Fill #0

## 2016-07-02 MED FILL — SPIRONOLACTONE 25 MG TABLET: 25 | 30 days supply | Qty: 30 | Fill #0

## 2016-07-14 ENCOUNTER — Telehealth: Payer: Self-pay | Admitting: *Deleted

## 2016-07-14 NOTE — Telephone Encounter (Signed)
Patient verified DOB Patient is aware of hep b being present and [facility]  Referral being placed. Patient is aware of needing to complete CAFA/GCCN paperwork. Patient plans to "walk-in" on friday for financial counseling. No further questions at this time.

## 2016-07-14 NOTE — Telephone Encounter (Signed)
-----   Message from Jaclyn Shaggy, MD sent at 06/26/2016  4:58 PM EDT ----- Blood tests reveals hepatitis B infection is still present. I will refer her to infectious disease.

## 2016-07-24 ENCOUNTER — Ambulatory Visit: Payer: Self-pay | Admitting: Family Medicine

## 2016-10-08 ENCOUNTER — Emergency Department (HOSPITAL_COMMUNITY)
Admission: EM | Admit: 2016-10-08 | Discharge: 2016-10-08 | Disposition: A | Discharge status: Home / Self Care | Payer: No Typology Code available for payment source | Attending: Emergency Medicine | Admitting: Emergency Medicine

## 2016-10-08 ENCOUNTER — Encounter (HOSPITAL_COMMUNITY): Payer: Self-pay | Admitting: Emergency Medicine

## 2016-10-08 ENCOUNTER — Emergency Department (HOSPITAL_COMMUNITY): Payer: No Typology Code available for payment source

## 2016-10-08 DIAGNOSIS — Z7982 Long term (current) use of aspirin: Secondary | ICD-10-CM | POA: Insufficient documentation

## 2016-10-08 DIAGNOSIS — I10 Essential (primary) hypertension: Secondary | ICD-10-CM | POA: Diagnosis not present

## 2016-10-08 DIAGNOSIS — M25552 Pain in left hip: Secondary | ICD-10-CM | POA: Diagnosis present

## 2016-10-08 DIAGNOSIS — J449 Chronic obstructive pulmonary disease, unspecified: Secondary | ICD-10-CM | POA: Insufficient documentation

## 2016-10-08 DIAGNOSIS — Z79899 Other long term (current) drug therapy: Secondary | ICD-10-CM | POA: Diagnosis not present

## 2016-10-08 DIAGNOSIS — F17210 Nicotine dependence, cigarettes, uncomplicated: Secondary | ICD-10-CM | POA: Insufficient documentation

## 2016-10-08 MED ORDER — HYDROCODONE-ACETAMINOPHEN 5-325 MG PO TABS
1.0000 | ORAL_TABLET | Freq: Once | ORAL | Status: AC
  Administered 2016-10-08: 1 via ORAL
  Filled 2016-10-08: qty 1

## 2016-10-08 MED ORDER — CYCLOBENZAPRINE HCL 10 MG PO TABS: 10.0000 mg | ORAL_TABLET | Freq: Two times a day (BID) | ORAL | 0 refills | Status: DC | PRN

## 2016-10-08 MED ORDER — DICLOFENAC SODIUM 50 MG PO TBEC: 50.0000 mg | DELAYED_RELEASE_TABLET | Freq: Two times a day (BID) | ORAL | 0 refills | Status: DC

## 2016-10-08 NOTE — Discharge Instructions (Signed)
Your x-rays today show no fracture or dislocation of the bones. Take the medication as directed. Do not take the muscle relaxant if driving as it will make you sleepy. Return as needed for worsening symptoms.
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
## 2016-10-08 NOTE — ED Notes (Signed)
Patient ambulated to the restroom and back to room.
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
## 2016-12-15 ENCOUNTER — Ambulatory Visit: Payer: Self-pay

## 2016-12-28 ENCOUNTER — Ambulatory Visit: Payer: Self-pay | Admitting: Family Medicine
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
## 2017-03-29 ENCOUNTER — Telehealth: Payer: Self-pay | Admitting: Family Medicine

## 2017-03-29 NOTE — Telephone Encounter (Signed)
Sorry she was given an appt date, hasnt been seen since may of 2018. Needed refills. No action needed.

## 2017-03-29 NOTE — Telephone Encounter (Signed)
Patient called and

## 2017-04-13 ENCOUNTER — Ambulatory Visit: Payer: Self-pay | Admitting: Family Medicine

## 2017-05-03 ENCOUNTER — Ambulatory Visit: Payer: Self-pay | Admitting: Family Medicine

## 2017-06-03 ENCOUNTER — Ambulatory Visit: Payer: Self-pay

## 2017-10-23 ENCOUNTER — Emergency Department (HOSPITAL_COMMUNITY): Payer: Self-pay

## 2017-10-23 ENCOUNTER — Encounter (HOSPITAL_COMMUNITY): Payer: Self-pay

## 2017-10-23 ENCOUNTER — Emergency Department (HOSPITAL_COMMUNITY)
Admission: EM | Admit: 2017-10-23 | Discharge: 2017-10-23 | Disposition: A | Discharge status: Home / Self Care | Payer: Self-pay | Attending: Emergency Medicine | Admitting: Emergency Medicine

## 2017-10-23 ENCOUNTER — Other Ambulatory Visit: Payer: Self-pay

## 2017-10-23 DIAGNOSIS — G8929 Other chronic pain: Secondary | ICD-10-CM | POA: Insufficient documentation

## 2017-10-23 DIAGNOSIS — S0011XA Contusion of right eyelid and periocular area, initial encounter: Secondary | ICD-10-CM | POA: Insufficient documentation

## 2017-10-23 DIAGNOSIS — Y929 Unspecified place or not applicable: Secondary | ICD-10-CM | POA: Insufficient documentation

## 2017-10-23 DIAGNOSIS — Y939 Activity, unspecified: Secondary | ICD-10-CM | POA: Insufficient documentation

## 2017-10-23 DIAGNOSIS — F17210 Nicotine dependence, cigarettes, uncomplicated: Secondary | ICD-10-CM | POA: Insufficient documentation

## 2017-10-23 DIAGNOSIS — Y999 Unspecified external cause status: Secondary | ICD-10-CM | POA: Insufficient documentation

## 2017-10-23 DIAGNOSIS — Z7982 Long term (current) use of aspirin: Secondary | ICD-10-CM | POA: Insufficient documentation

## 2017-10-23 DIAGNOSIS — Z79899 Other long term (current) drug therapy: Secondary | ICD-10-CM | POA: Insufficient documentation

## 2017-10-23 DIAGNOSIS — S0083XA Contusion of other part of head, initial encounter: Secondary | ICD-10-CM

## 2017-10-23 DIAGNOSIS — I10 Essential (primary) hypertension: Secondary | ICD-10-CM | POA: Insufficient documentation

## 2017-10-23 DIAGNOSIS — Z86711 Personal history of pulmonary embolism: Secondary | ICD-10-CM | POA: Insufficient documentation

## 2017-10-23 DIAGNOSIS — M25552 Pain in left hip: Secondary | ICD-10-CM | POA: Insufficient documentation

## 2017-10-23 DIAGNOSIS — W19XXXA Unspecified fall, initial encounter: Secondary | ICD-10-CM | POA: Insufficient documentation

## 2017-10-23 DIAGNOSIS — J45909 Unspecified asthma, uncomplicated: Secondary | ICD-10-CM | POA: Insufficient documentation

## 2017-10-23 DIAGNOSIS — M545 Low back pain: Secondary | ICD-10-CM | POA: Insufficient documentation

## 2017-10-23 MED ORDER — IBUPROFEN 200 MG PO TABS: 600.0000 mg | ORAL_TABLET | Freq: Once | ORAL | Status: DC

## 2017-10-23 MED ORDER — NAPROXEN 500 MG PO TABS: 500.0000 mg | ORAL_TABLET | Freq: Two times a day (BID) | ORAL | 0 refills | Status: DC

## 2017-10-23 MED ORDER — IBUPROFEN 200 MG PO TABS
600.0000 mg | ORAL_TABLET | Freq: Once | ORAL | Status: AC
  Administered 2017-10-23: 600 mg via ORAL
  Filled 2017-10-23: qty 3

## 2017-10-23 NOTE — ED Triage Notes (Signed)
She states she fell at home while walking in her driveway "tripped on some gravel". She c/o left hip pain and h/a. She has edema with ecchymosis at right orbit area. She is in no distress.

## 2017-10-23 NOTE — Discharge Instructions (Addendum)
Apply ice to your eye for 20 minutes at a time. You can take naproxen every 12 hours as needed for pain. Patient appointment with your primary care provider to follow-up on your visit today. Return to the ER for sudden vision loss, severe headache, vomiting, or new or concerning symptoms.
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
## 2017-12-28 ENCOUNTER — Telehealth: Payer: Self-pay | Admitting: Family Medicine

## 2017-12-28 MED ORDER — ALBUTEROL SULFATE HFA 108 (90 BASE) MCG/ACT IN AERS: 2.0000 | INHALATION_SPRAY | RESPIRATORY_TRACT | 2 refills | Status: DC | PRN

## 2017-12-28 NOTE — Telephone Encounter (Signed)
Refilled

## 2017-12-28 NOTE — Telephone Encounter (Signed)
1) Medication(s) Requested (by name):inhaler    2) Pharmacy of Choice:[facility]   3) Special Requests: Patient has an appointment to see Mcclung on Friday please let me know so I can call the patient.   Approved medications will be sent to the pharmacy, we will reach out if there is an issue.  Requests made after 3pm may not be addressed until the following business day!  If a patient is unsure of the name of the medication(s) please note and ask patient to call back when they are able to provide all info, do not send to responsible party until all information is available!

## 2017-12-29 NOTE — Telephone Encounter (Signed)
Called the patient there was no answer  °

## 2017-12-31 ENCOUNTER — Ambulatory Visit: Payer: Self-pay | Attending: Family Medicine | Admitting: Physician Assistant

## 2017-12-31 ENCOUNTER — Ambulatory Visit: Payer: Self-pay

## 2017-12-31 VITALS — BP 188/83 | HR 80 | Temp 98.0°F | Wt 183.0 lb

## 2017-12-31 DIAGNOSIS — K7030 Alcoholic cirrhosis of liver without ascites: Secondary | ICD-10-CM | POA: Insufficient documentation

## 2017-12-31 DIAGNOSIS — I10 Essential (primary) hypertension: Secondary | ICD-10-CM | POA: Insufficient documentation

## 2017-12-31 DIAGNOSIS — Z86711 Personal history of pulmonary embolism: Secondary | ICD-10-CM | POA: Insufficient documentation

## 2017-12-31 DIAGNOSIS — M62838 Other muscle spasm: Secondary | ICD-10-CM | POA: Insufficient documentation

## 2017-12-31 DIAGNOSIS — M5432 Sciatica, left side: Secondary | ICD-10-CM | POA: Insufficient documentation

## 2017-12-31 DIAGNOSIS — J9801 Acute bronchospasm: Secondary | ICD-10-CM | POA: Insufficient documentation

## 2017-12-31 DIAGNOSIS — J40 Bronchitis, not specified as acute or chronic: Secondary | ICD-10-CM | POA: Insufficient documentation

## 2017-12-31 DIAGNOSIS — M1990 Unspecified osteoarthritis, unspecified site: Secondary | ICD-10-CM | POA: Insufficient documentation

## 2017-12-31 MED ORDER — PROPRANOLOL HCL 20 MG PO TABS: 20.0000 mg | ORAL_TABLET | Freq: Every day | ORAL | 3 refills | Status: DC

## 2017-12-31 MED ORDER — FUROSEMIDE 20 MG PO TABS: 20.0000 mg | ORAL_TABLET | Freq: Every day | ORAL | 3 refills | Status: DC

## 2017-12-31 MED ORDER — SPIRONOLACTONE 25 MG PO TABS: 25.0000 mg | ORAL_TABLET | Freq: Every day | ORAL | 3 refills | Status: DC

## 2017-12-31 MED ORDER — NAPROXEN 500 MG PO TABS: 500.0000 mg | ORAL_TABLET | Freq: Two times a day (BID) | ORAL | 1 refills | Status: DC

## 2017-12-31 MED ORDER — METHOCARBAMOL 500 MG PO TABS: 500.0000 mg | ORAL_TABLET | Freq: Three times a day (TID) | ORAL | 1 refills | Status: DC

## 2017-12-31 MED ORDER — ALBUTEROL SULFATE HFA 108 (90 BASE) MCG/ACT IN AERS: 2.0000 | INHALATION_SPRAY | RESPIRATORY_TRACT | 2 refills | Status: DC | PRN

## 2017-12-31 MED ORDER — IPRATROPIUM-ALBUTEROL 0.5-2.5 (3) MG/3ML IN SOLN
3.0000 mL | Freq: Once | RESPIRATORY_TRACT | Status: AC
  Administered 2017-12-31: 3 mL via RESPIRATORY_TRACT

## 2017-12-31 MED ORDER — FLUTICASONE-SALMETEROL 250-50 MCG/DOSE IN AEPB: 1.0000 | INHALATION_SPRAY | Freq: Two times a day (BID) | RESPIRATORY_TRACT | 3 refills | Status: DC

## 2017-12-31 MED ORDER — ASPIRIN EC 81 MG PO TBEC: 81.0000 mg | DELAYED_RELEASE_TABLET | Freq: Every day | ORAL | 1 refills | Status: AC

## 2017-12-31 MED ORDER — AZITHROMYCIN 250 MG PO TABS: ORAL_TABLET | ORAL | 0 refills | Status: DC

## 2017-12-31 MED ORDER — PANTOPRAZOLE SODIUM 40 MG PO TBEC: 40.0000 mg | DELAYED_RELEASE_TABLET | Freq: Every day | ORAL | 3 refills | Status: AC

## 2017-12-31 NOTE — Progress Notes (Signed)
Patient needs refill on inhaler. 
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
## 2018-01-01 LAB — CBC WITH DIFFERENTIAL/PLATELET
BASOS: 1 %
Basophils Absolute: 0.1 10*3/uL (ref 0.0–0.2)
EOS (ABSOLUTE): 0.1 10*3/uL (ref 0.0–0.4)
Eos: 1 %
Hematocrit: 38.6 % (ref 34.0–46.6)
Hemoglobin: 13.5 g/dL (ref 11.1–15.9)
IMMATURE GRANULOCYTES: 1 %
Immature Grans (Abs): 0 10*3/uL (ref 0.0–0.1)
Lymphocytes Absolute: 2.2 10*3/uL (ref 0.7–3.1)
Lymphs: 42 %
MCH: 32.1 pg (ref 26.6–33.0)
MCHC: 35 g/dL (ref 31.5–35.7)
MCV: 92 fL (ref 79–97)
Monocytes Absolute: 0.5 10*3/uL (ref 0.1–0.9)
Monocytes: 9 %
NEUTROS PCT: 46 %
Neutrophils Absolute: 2.4 10*3/uL (ref 1.4–7.0)
PLATELETS: 334 10*3/uL (ref 150–450)
RBC: 4.2 x10E6/uL (ref 3.77–5.28)
RDW: 12.7 % (ref 12.3–15.4)
WBC: 5.3 10*3/uL (ref 3.4–10.8)

## 2018-01-01 LAB — COMPREHENSIVE METABOLIC PANEL
A/G RATIO: 1 — AB (ref 1.2–2.2)
ALT: 13 IU/L (ref 0–32)
AST: 37 IU/L (ref 0–40)
Albumin: 3.8 g/dL (ref 3.5–5.5)
Alkaline Phosphatase: 98 IU/L (ref 39–117)
BILIRUBIN TOTAL: 0.5 mg/dL (ref 0.0–1.2)
BUN/Creatinine Ratio: 8 — ABNORMAL LOW (ref 9–23)
BUN: 5 mg/dL — ABNORMAL LOW (ref 6–24)
CALCIUM: 9.5 mg/dL (ref 8.7–10.2)
CO2: 21 mmol/L (ref 20–29)
Chloride: 93 mmol/L — ABNORMAL LOW (ref 96–106)
Creatinine, Ser: 0.63 mg/dL (ref 0.57–1.00)
GFR calc Af Amer: 115 mL/min/{1.73_m2} (ref >59)
GFR, EST NON AFRICAN AMERICAN: 100 mL/min/{1.73_m2} (ref >59)
GLOBULIN, TOTAL: 3.9 g/dL (ref 1.5–4.5)
GLUCOSE: 77 mg/dL (ref 65–99)
POTASSIUM: 4.1 mmol/L (ref 3.5–5.2)
SODIUM: 134 mmol/L (ref 134–144)
Total Protein: 7.7 g/dL (ref 6.0–8.5)

## 2018-01-01 LAB — LIPID PANEL
CHOL/HDL RATIO: 1.6 ratio (ref 0.0–4.4)
Cholesterol, Total: 207 mg/dL — ABNORMAL HIGH (ref 100–199)
HDL: 129 mg/dL (ref >39)
LDL CALC: 64 mg/dL (ref 0–99)
TRIGLYCERIDES: 70 mg/dL (ref 0–149)
VLDL Cholesterol Cal: 14 mg/dL (ref 5–40)

## 2018-01-05 ENCOUNTER — Telehealth: Payer: Self-pay

## 2018-01-05 NOTE — Telephone Encounter (Signed)
Patient was called and there is no voicemail set up to leave a message.    If patient returns phone call please inform them of lab results below.

## 2018-01-05 NOTE — Telephone Encounter (Signed)
-----   Message from Anders Simmonds, [place] sent at 01/02/2018  5:25 PM EST ----- Please call patient.  Labs are normal.  Follow-up as planned.  Thanks, Georgian Co, PA-C

## 2018-04-04 ENCOUNTER — Ambulatory Visit: Payer: Self-pay | Admitting: Family Medicine

## 2018-04-13 ENCOUNTER — Ambulatory Visit: Payer: Self-pay

## 2018-04-14 ENCOUNTER — Ambulatory Visit: Payer: Medicaid Other | Attending: Family Medicine | Admitting: Physician Assistant

## 2018-04-14 VITALS — BP 175/101 | HR 98 | Temp 98.5°F | Ht 64.0 in | Wt 186.0 lb

## 2018-04-14 DIAGNOSIS — G5603 Carpal tunnel syndrome, bilateral upper limbs: Secondary | ICD-10-CM | POA: Diagnosis not present

## 2018-04-14 DIAGNOSIS — I10 Essential (primary) hypertension: Secondary | ICD-10-CM | POA: Insufficient documentation

## 2018-04-14 DIAGNOSIS — Z791 Long term (current) use of non-steroidal anti-inflammatories (NSAID): Secondary | ICD-10-CM | POA: Insufficient documentation

## 2018-04-14 DIAGNOSIS — Z86711 Personal history of pulmonary embolism: Secondary | ICD-10-CM | POA: Insufficient documentation

## 2018-04-14 DIAGNOSIS — M25552 Pain in left hip: Secondary | ICD-10-CM | POA: Diagnosis not present

## 2018-04-14 DIAGNOSIS — R202 Paresthesia of skin: Secondary | ICD-10-CM

## 2018-04-14 DIAGNOSIS — M1990 Unspecified osteoarthritis, unspecified site: Secondary | ICD-10-CM | POA: Insufficient documentation

## 2018-04-14 DIAGNOSIS — Z7951 Long term (current) use of inhaled steroids: Secondary | ICD-10-CM | POA: Insufficient documentation

## 2018-04-14 DIAGNOSIS — F1410 Cocaine abuse, uncomplicated: Secondary | ICD-10-CM | POA: Diagnosis not present

## 2018-04-14 DIAGNOSIS — M5432 Sciatica, left side: Secondary | ICD-10-CM | POA: Diagnosis not present

## 2018-04-14 DIAGNOSIS — J45909 Unspecified asthma, uncomplicated: Secondary | ICD-10-CM | POA: Insufficient documentation

## 2018-04-14 DIAGNOSIS — K7030 Alcoholic cirrhosis of liver without ascites: Secondary | ICD-10-CM | POA: Diagnosis not present

## 2018-04-14 DIAGNOSIS — J9801 Acute bronchospasm: Secondary | ICD-10-CM

## 2018-04-14 DIAGNOSIS — Z79899 Other long term (current) drug therapy: Secondary | ICD-10-CM | POA: Insufficient documentation

## 2018-04-14 DIAGNOSIS — M62838 Other muscle spasm: Secondary | ICD-10-CM | POA: Insufficient documentation

## 2018-04-14 MED ORDER — SPIRONOLACTONE 25 MG PO TABS: 25.0000 mg | ORAL_TABLET | Freq: Every day | ORAL | 3 refills | Status: DC

## 2018-04-14 MED ORDER — CLONIDINE HCL 0.2 MG PO TABS: 0.2000 mg | ORAL_TABLET | Freq: Once | ORAL | Status: DC

## 2018-04-14 MED ORDER — GABAPENTIN 300 MG PO CAPS: 300.0000 mg | ORAL_CAPSULE | Freq: Every day | ORAL | 3 refills | Status: DC

## 2018-04-14 MED ORDER — METHOCARBAMOL 500 MG PO TABS: 500.0000 mg | ORAL_TABLET | Freq: Three times a day (TID) | ORAL | 1 refills | Status: DC

## 2018-04-14 MED ORDER — FLUTICASONE-SALMETEROL 250-50 MCG/DOSE IN AEPB: 1.0000 | INHALATION_SPRAY | Freq: Two times a day (BID) | RESPIRATORY_TRACT | 3 refills | Status: DC

## 2018-04-14 MED ORDER — FUROSEMIDE 20 MG PO TABS: 20.0000 mg | ORAL_TABLET | Freq: Every day | ORAL | 3 refills | Status: AC

## 2018-04-14 MED ORDER — LISINOPRIL 20 MG PO TABS: 20.0000 mg | ORAL_TABLET | Freq: Every day | ORAL | 3 refills | Status: AC

## 2018-04-14 NOTE — Patient Instructions (Addendum)
Check blood pressure out of office 3 to 5 times per week and record and bring to next visit.    Obtain R wrist splint.  Wear both wrist splints at night   Carpal Tunnel Syndrome  Carpal tunnel syndrome is a condition that causes pain in your hand and arm. The carpal tunnel is a narrow area that is on the palm side of your wrist. Repeated wrist motion or certain diseases may cause swelling in the tunnel. This swelling can pinch the main nerve in the wrist (median nerve). What are the causes? This condition may be caused by:  Repeated wrist motions.  Wrist injuries.  Arthritis.  A sac of fluid (cyst) or abnormal growth (tumor) in the carpal tunnel.  Fluid buildup during pregnancy. Sometimes the cause is not known. What increases the risk? The following factors may make you more likely to develop this condition:  Having a job in which you move your wrist in the same way many times. This includes jobs like being a Midwife or a Conservation officer, nature.  Being a woman.  Having other health conditions, such as: ? Diabetes. ? Obesity. ? A thyroid gland that is not active enough (hypothyroidism). ? Kidney failure. What are the signs or symptoms? Symptoms of this condition include:  A tingling feeling in your fingers.  Tingling or a loss of feeling (numbness) in your hand.  Pain in your entire arm. This pain may get worse when you bend your wrist and elbow for a long time.  Pain in your wrist that goes up your arm to your shoulder.  Pain that goes down into your palm or fingers.  A weak feeling in your hands. You may find it hard to grab and hold items. You may feel worse at night. How is this diagnosed? This condition is diagnosed with a medical history and physical exam. You may also have tests, such as:  Electromyogram (EMG). This test checks the signals that the nerves send to the muscles.  Nerve conduction study. This test checks how well signals pass through your nerves.  Imaging  tests, such as X-rays, ultrasound, and MRI. These tests check for what might be the cause of your condition. How is this treated? This condition may be treated with:  Lifestyle changes. You will be asked to stop or change the activity that caused your problem.  Doing exercise and activities that make bones and muscles stronger (physical therapy).  Learning how to use your hand again (occupational therapy).  Medicines for pain and swelling (inflammation). You may have injections in your wrist.  A wrist splint.  Surgery. Follow these instructions at home: If you have a splint:  Wear the splint as told by your doctor. Remove it only as told by your doctor.  Loosen the splint if your fingers: ? Tingle. ? Lose feeling (become numb). ? Turn cold and blue.  Keep the splint clean.  If the splint is not waterproof: ? Do not let it get wet. ? Cover it with a watertight covering when you take a bath or a shower. Managing pain, stiffness, and swelling   If told, put ice on the painful area: ? If you have a removable splint, remove it as told by your doctor. ? Put ice in a plastic bag. ? Place a towel between your skin and the bag. ? Leave the ice on for 20 minutes, 2-3 times per day. General instructions  Take over-the-counter and prescription medicines only as told by your doctor.  Rest your wrist from any activity that may cause pain. If needed, talk with your boss at work about changes that can help your wrist heal.  Do any exercises as told by your doctor, physical therapist, or occupational therapist.  Keep all follow-up visits as told by your doctor. This is important. Contact a doctor if:  You have new symptoms.  Medicine does not help your pain.  Your symptoms get worse. Get help right away if:  You have very bad numbness or tingling in your wrist or hand. Summary  Carpal tunnel syndrome is a condition that causes pain in your hand and arm.  It is often caused  by repeated wrist motions.  Lifestyle changes and medicines are used to treat this problem. Surgery may help in very bad cases.  Follow your doctor's instructions about wearing a splint, resting your wrist, keeping follow-up visits, and calling for help. This information is not intended to replace advice given to you by your health care provider. Make sure you discuss any questions you have with your health care provider. Document Released: 01/15/2011 Document Revised: 06/04/2017 Document Reviewed: 06/04/2017 Elsevier Interactive Patient Education  2019 [employer].
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
## 2018-04-14 NOTE — Progress Notes (Signed)
Patient stated her leg pain and hip has been going on for a while.  Pt. Stated she had a cough, sneezing and when she blew her nose there was blood with clot.

## 2018-04-25 ENCOUNTER — Ambulatory Visit (INDEPENDENT_AMBULATORY_CARE_PROVIDER_SITE_OTHER): Payer: Self-pay

## 2018-04-25 ENCOUNTER — Ambulatory Visit (HOSPITAL_COMMUNITY)
Admission: EM | Admit: 2018-04-25 | Discharge: 2018-04-25 | Disposition: A | Discharge status: Home / Self Care | Payer: Self-pay | Attending: Family Medicine | Admitting: Family Medicine

## 2018-04-25 ENCOUNTER — Encounter (HOSPITAL_COMMUNITY): Payer: Self-pay

## 2018-04-25 ENCOUNTER — Other Ambulatory Visit: Payer: Self-pay

## 2018-04-25 DIAGNOSIS — R0781 Pleurodynia: Secondary | ICD-10-CM

## 2018-04-25 DIAGNOSIS — R0789 Other chest pain: Secondary | ICD-10-CM

## 2018-04-25 DIAGNOSIS — W19XXXA Unspecified fall, initial encounter: Secondary | ICD-10-CM

## 2018-04-25 MED ORDER — ACETAMINOPHEN 500 MG PO TABS: 500.0000 mg | ORAL_TABLET | Freq: Four times a day (QID) | ORAL | 0 refills | Status: AC | PRN
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
## 2018-05-04 MED FILL — METHOCARBAMOL 500 MG TABS: 500 | 30 days supply | Qty: 90 | Fill #0

## 2018-05-04 MED FILL — !PROVENTIL HFA 90 MCG INH: 108 (90 BAS | 16 days supply | Qty: 18 | Fill #0

## 2018-05-04 MED FILL — LISINOPRIL 20 MG TABLET: 20 | 30 days supply | Qty: 30 | Fill #0

## 2018-05-12 ENCOUNTER — Ambulatory Visit: Payer: Self-pay | Admitting: Family Medicine

## 2018-05-18 ENCOUNTER — Other Ambulatory Visit: Payer: Self-pay

## 2018-05-18 ENCOUNTER — Encounter: Payer: Self-pay | Admitting: Primary Care

## 2018-05-18 ENCOUNTER — Ambulatory Visit: Payer: Medicaid Other | Attending: Primary Care | Admitting: Primary Care

## 2018-05-18 ENCOUNTER — Inpatient Hospital Stay: Payer: Self-pay

## 2018-05-18 DIAGNOSIS — J9801 Acute bronchospasm: Secondary | ICD-10-CM | POA: Diagnosis present

## 2018-05-18 DIAGNOSIS — Z86711 Personal history of pulmonary embolism: Secondary | ICD-10-CM | POA: Diagnosis not present

## 2018-05-18 DIAGNOSIS — F17210 Nicotine dependence, cigarettes, uncomplicated: Secondary | ICD-10-CM | POA: Insufficient documentation

## 2018-05-18 DIAGNOSIS — W19XXXA Unspecified fall, initial encounter: Secondary | ICD-10-CM

## 2018-05-18 DIAGNOSIS — Y9301 Activity, walking, marching and hiking: Secondary | ICD-10-CM | POA: Diagnosis not present

## 2018-05-18 DIAGNOSIS — Z791 Long term (current) use of non-steroidal anti-inflammatories (NSAID): Secondary | ICD-10-CM | POA: Diagnosis not present

## 2018-05-18 DIAGNOSIS — W010XXA Fall on same level from slipping, tripping and stumbling without subsequent striking against object, initial encounter: Secondary | ICD-10-CM | POA: Diagnosis not present

## 2018-05-18 DIAGNOSIS — Z720 Tobacco use: Secondary | ICD-10-CM

## 2018-05-18 DIAGNOSIS — N644 Mastodynia: Secondary | ICD-10-CM | POA: Diagnosis not present

## 2018-05-18 DIAGNOSIS — Z79899 Other long term (current) drug therapy: Secondary | ICD-10-CM | POA: Insufficient documentation

## 2018-05-18 DIAGNOSIS — Z7982 Long term (current) use of aspirin: Secondary | ICD-10-CM | POA: Insufficient documentation

## 2018-05-18 DIAGNOSIS — Z8249 Family history of ischemic heart disease and other diseases of the circulatory system: Secondary | ICD-10-CM | POA: Insufficient documentation

## 2018-05-18 DIAGNOSIS — M1990 Unspecified osteoarthritis, unspecified site: Secondary | ICD-10-CM | POA: Insufficient documentation

## 2018-05-18 DIAGNOSIS — I10 Essential (primary) hypertension: Secondary | ICD-10-CM | POA: Diagnosis not present

## 2018-05-18 MED ORDER — FLUTICASONE-SALMETEROL 250-50 MCG/DOSE IN AEPB: 1.0000 | INHALATION_SPRAY | Freq: Two times a day (BID) | RESPIRATORY_TRACT | 3 refills | Status: AC

## 2018-05-18 MED ORDER — GUAIFENESIN ER 600 MG PO TB12: 600.0000 mg | ORAL_TABLET | Freq: Two times a day (BID) | ORAL | 0 refills | Status: AC

## 2018-05-18 MED ORDER — ALBUTEROL SULFATE HFA 108 (90 BASE) MCG/ACT IN AERS: 2.0000 | INHALATION_SPRAY | RESPIRATORY_TRACT | 2 refills | Status: AC | PRN
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
## 2018-05-18 NOTE — Progress Notes (Signed)
Pt states her chest still hurts for when she fell   Pt states she feels the pain in her back  Pt states the muscle relaxer are not helping

## 2018-06-13 ENCOUNTER — Ambulatory Visit: Payer: Medicaid Other | Attending: Family Medicine | Admitting: Family Medicine

## 2018-06-13 ENCOUNTER — Ambulatory Visit: Payer: Self-pay | Admitting: Family Medicine

## 2018-06-13 ENCOUNTER — Other Ambulatory Visit: Payer: Self-pay

## 2018-06-13 ENCOUNTER — Encounter: Payer: Self-pay | Admitting: Family Medicine

## 2018-06-13 DIAGNOSIS — K7030 Alcoholic cirrhosis of liver without ascites: Secondary | ICD-10-CM | POA: Diagnosis not present

## 2018-06-13 DIAGNOSIS — R0789 Other chest pain: Secondary | ICD-10-CM

## 2018-06-13 DIAGNOSIS — K0889 Other specified disorders of teeth and supporting structures: Secondary | ICD-10-CM

## 2018-06-13 DIAGNOSIS — R202 Paresthesia of skin: Secondary | ICD-10-CM

## 2018-06-13 DIAGNOSIS — I10 Essential (primary) hypertension: Secondary | ICD-10-CM

## 2018-06-13 DIAGNOSIS — M5432 Sciatica, left side: Secondary | ICD-10-CM

## 2018-06-13 DIAGNOSIS — Z1239 Encounter for other screening for malignant neoplasm of breast: Secondary | ICD-10-CM

## 2018-06-13 MED ORDER — GABAPENTIN 300 MG PO CAPS: 300.0000 mg | ORAL_CAPSULE | Freq: Every day | ORAL | 3 refills | Status: AC

## 2018-06-13 MED ORDER — SPIRONOLACTONE 25 MG PO TABS: 25.0000 mg | ORAL_TABLET | Freq: Every day | ORAL | 1 refills | Status: AC

## 2018-06-13 MED ORDER — METHOCARBAMOL 500 MG PO TABS: 500.0000 mg | ORAL_TABLET | Freq: Three times a day (TID) | ORAL | 1 refills | Status: AC

## 2018-06-13 MED ORDER — NAPROXEN 500 MG PO TABS: 500.0000 mg | ORAL_TABLET | Freq: Two times a day (BID) | ORAL | 1 refills | Status: AC

## 2018-06-13 MED FILL — SPIRONOLACTONE 25 MG TABLET: 25 | 30 days supply | Qty: 30 | Fill #0

## 2018-06-13 MED FILL — GABAPENTIN 300 MG CAPSULE: 300 | 30 days supply | Qty: 30 | Fill #0

## 2018-06-13 MED FILL — METHOCARBAMOL 500 MG TABS: 500 | 30 days supply | Qty: 90 | Fill #0

## 2018-06-13 MED FILL — NAPROXEN 500 MG TABLET: 500 | 30 days supply | Qty: 60 | Fill #0
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
## 2018-06-13 NOTE — Progress Notes (Signed)
Patient has been called and DOB has been verified. Patient has been screened and transferred to PCP to start phone visit.  C/C: diabetes   

## 2018-06-14 ENCOUNTER — Telehealth: Payer: Self-pay | Admitting: Family Medicine

## 2018-06-14 NOTE — Telephone Encounter (Signed)
She requested a Large at her visit with me and I informed her we had medium wrist splints per your information.  Please check with her and see what she wants.

## 2018-06-14 NOTE — Telephone Encounter (Signed)
Patient wanted to check if her wristband was going to be prescribed to her due to her carpel tunnel. Please follow up.

## 2018-06-14 NOTE — Telephone Encounter (Signed)
We only have medium wrist splints in the office. Please follow up.

## 2018-06-15 MED ORDER — MISC. DEVICES MISC: 0 refills | Status: AC

## 2018-06-15 NOTE — Telephone Encounter (Signed)
I have written her a prescription.

## 2018-06-15 NOTE — Telephone Encounter (Signed)
Patient want to know if she has to buy OTC or will you write her a prescription for the wrist splint.

## 2018-06-16 NOTE — Telephone Encounter (Signed)
Script [facility]

## 2018-06-17 MED FILL — LISINOPRIL 20 MG TAB: 20 | 30 days supply | Qty: 30 | Fill #1

## 2018-06-20 ENCOUNTER — Other Ambulatory Visit: Payer: Self-pay

## 2018-06-20 ENCOUNTER — Ambulatory Visit
Admission: RE | Admit: 2018-06-20 | Discharge: 2018-06-20 | Disposition: A | Discharge status: Home / Self Care | Payer: Medicaid Other | Source: Ambulatory Visit | Attending: Family Medicine | Admitting: Family Medicine

## 2018-06-20 DIAGNOSIS — Z1239 Encounter for other screening for malignant neoplasm of breast: Secondary | ICD-10-CM

## 2018-06-23 ENCOUNTER — Telehealth: Payer: Self-pay

## 2018-06-23 NOTE — Telephone Encounter (Signed)
Patient name and DOB has been verified Patient was informed of lab results. Patient had no questions.  

## 2018-06-23 NOTE — Telephone Encounter (Signed)
-----   Message from Hoy Register, MD sent at 06/21/2018  1:44 PM EDT ----- Mammogram is negative for malignancy

## 2018-07-19 ENCOUNTER — Other Ambulatory Visit: Payer: Self-pay

## 2018-07-19 ENCOUNTER — Ambulatory Visit: Payer: Medicaid Other | Attending: Family Medicine | Admitting: Family Medicine

## 2018-07-20 ENCOUNTER — Other Ambulatory Visit: Payer: Self-pay

## 2018-07-20 ENCOUNTER — Ambulatory Visit: Payer: Medicaid Other | Attending: Family Medicine | Admitting: Physician Assistant

## 2018-07-20 DIAGNOSIS — R05 Cough: Secondary | ICD-10-CM | POA: Diagnosis not present

## 2018-07-20 DIAGNOSIS — G5602 Carpal tunnel syndrome, left upper limb: Secondary | ICD-10-CM | POA: Diagnosis not present

## 2018-07-20 DIAGNOSIS — R040 Epistaxis: Secondary | ICD-10-CM | POA: Diagnosis not present

## 2018-07-20 DIAGNOSIS — R059 Cough, unspecified: Secondary | ICD-10-CM

## 2018-07-20 MED ORDER — BENZONATATE 100 MG PO CAPS: 200.0000 mg | ORAL_CAPSULE | Freq: Three times a day (TID) | ORAL | 0 refills | Status: AC | PRN

## 2018-07-20 NOTE — Progress Notes (Signed)
Virtual Visit via Telephone Note  I connected with Alexandria Jacobson on 07/20/18 at 10:10 AM EDT by telephone and verified that I am speaking with the correct person using two identifiers.   I discussed the limitations, risks, security and privacy concerns of performing an evaluation and management service by telephone and the availability of in person appointments. I also discussed with the patient that there may be a patient responsible charge related to this service. The patient expressed understanding and agreed to proceed.   Patient location:  home My Location:  [facility] office Persons on the call:  Myself and the patient  History of Present Illness:  1 week h/o nose bleeds/clots coming out of her nose when she starts coughing/feels anxious.  Nose bleed lasting 5-10 mins.  Denies intranasal meds/substances  No cold s/sx.  No fever.  No aspirin or blood thinners.  Does drink alcohol.    L wrist pain with carpal tunnel.      Observations/Objective:  Speech is clear.  A&Ox3   Assessment and Plan: 1. Cough - benzonatate (TESSALON) 100 MG capsule; Take 2 capsules (200 mg total) by mouth 3 (three) times daily as needed for cough.  Dispense: 40 capsule; Refill: 0  2. Carpal tunnel syndrome of left wrist Wear wrist splints - Ambulatory referral to Orthopedic Surgery  3. Epistaxis Do not drink alcohol.  vaseline intranasally tid.  Can use afrin sparingly.  Go to ED/UC if unable to stop nose bleed. - Ambulatory referral to ENT    Follow Up Instructions: See PCP in 1 month   I discussed the assessment and treatment plan with the patient. The patient was provided an opportunity to ask questions and all were answered. The patient agreed with the plan and demonstrated an understanding of the instructions.   The patient was advised to call back or seek an in-person evaluation if the symptoms worsen or if the condition fails to improve as anticipated.  I provided 13 minutes of  non-face-to-face time during this encounter.   Freeman Caldron, PA-C  Patient ID: Alexandria Jacobson, female   DOB: 1960/12/20, 58 y.o.   MRN: [id number]

## 2018-07-26 ENCOUNTER — Encounter: Payer: Self-pay | Admitting: Orthopaedic Surgery

## 2018-07-26 ENCOUNTER — Ambulatory Visit (INDEPENDENT_AMBULATORY_CARE_PROVIDER_SITE_OTHER): Payer: Medicaid Other

## 2018-07-26 ENCOUNTER — Ambulatory Visit (INDEPENDENT_AMBULATORY_CARE_PROVIDER_SITE_OTHER): Payer: Medicaid Other | Admitting: Orthopaedic Surgery

## 2018-07-26 ENCOUNTER — Other Ambulatory Visit: Payer: Self-pay

## 2018-07-26 DIAGNOSIS — M25532 Pain in left wrist: Secondary | ICD-10-CM | POA: Diagnosis not present

## 2018-07-26 MED ORDER — LIDOCAINE HCL 1 % IJ SOLN
0.3000 mL | INTRAMUSCULAR | Status: AC | PRN
  Administered 2018-07-26: .3 mL

## 2018-07-26 MED ORDER — BUPIVACAINE HCL 0.5 % IJ SOLN
0.3300 mL | INTRAMUSCULAR | Status: AC | PRN
  Administered 2018-07-26: .33 mL

## 2018-07-26 MED ORDER — METHYLPREDNISOLONE ACETATE 40 MG/ML IJ SUSP
13.3300 mg | INTRAMUSCULAR | Status: AC | PRN
  Administered 2018-07-26: 13.33 mg
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
## 2018-07-31 ENCOUNTER — Encounter: Payer: Self-pay | Admitting: Family Medicine

## 2018-08-01 ENCOUNTER — Telehealth: Payer: Self-pay | Admitting: Family Medicine
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
## 2018-08-01 NOTE — Telephone Encounter (Signed)
Noted  

## 2018-08-08 ENCOUNTER — Telehealth: Payer: Self-pay | Admitting: Family Medicine

## 2018-08-08 NOTE — Telephone Encounter (Signed)
Note not needed 
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
## 2018-08-10 DEATH — deceased

## 2018-08-15 ENCOUNTER — Ambulatory Visit: Payer: Medicaid Other | Admitting: Family Medicine
